# Patient Record
Sex: Male | Born: 1965 | Race: Black or African American | Hispanic: No | Marital: Single | Smoking: Current every day smoker
Health system: Southern US, Community
[De-identification: ages and names within clinical notes are randomized; demographics above are authoritative.]

## PROBLEM LIST (undated history)

## (undated) DIAGNOSIS — IMO0001 Reserved for inherently not codable concepts without codable children: Secondary | ICD-10-CM

## (undated) DIAGNOSIS — I10 Essential (primary) hypertension: Secondary | ICD-10-CM

## (undated) DIAGNOSIS — K219 Gastro-esophageal reflux disease without esophagitis: Secondary | ICD-10-CM

## (undated) DIAGNOSIS — R51 Headache: Secondary | ICD-10-CM

## (undated) HISTORY — PX: KNEE SURGERY: SHX244

---

## 2007-02-10 ENCOUNTER — Emergency Department (HOSPITAL_COMMUNITY): Admission: EM | Admit: 2007-02-10 | Discharge: 2007-02-10 | Payer: Self-pay | Admitting: Emergency Medicine

## 2007-03-18 ENCOUNTER — Ambulatory Visit: Payer: Self-pay | Admitting: Family Medicine

## 2007-03-18 LAB — CONVERTED CEMR LAB
Bilirubin Urine: NEGATIVE
Blood in Urine, dipstick: NEGATIVE
WBC Urine, dipstick: NEGATIVE
pH: 6.5

## 2007-03-20 ENCOUNTER — Encounter (INDEPENDENT_AMBULATORY_CARE_PROVIDER_SITE_OTHER): Payer: Self-pay | Admitting: Family Medicine

## 2007-03-29 ENCOUNTER — Ambulatory Visit: Payer: Self-pay | Admitting: Family Medicine

## 2007-03-29 ENCOUNTER — Telehealth (INDEPENDENT_AMBULATORY_CARE_PROVIDER_SITE_OTHER): Payer: Self-pay | Admitting: Family Medicine

## 2007-07-12 ENCOUNTER — Ambulatory Visit: Payer: Self-pay | Admitting: Family Medicine

## 2007-09-30 ENCOUNTER — Ambulatory Visit: Payer: Self-pay | Admitting: Family Medicine

## 2007-10-01 ENCOUNTER — Encounter (INDEPENDENT_AMBULATORY_CARE_PROVIDER_SITE_OTHER): Payer: Self-pay | Admitting: Family Medicine

## 2007-10-02 ENCOUNTER — Encounter (INDEPENDENT_AMBULATORY_CARE_PROVIDER_SITE_OTHER): Payer: Self-pay | Admitting: Family Medicine

## 2007-10-02 ENCOUNTER — Telehealth (INDEPENDENT_AMBULATORY_CARE_PROVIDER_SITE_OTHER): Payer: Self-pay | Admitting: *Deleted

## 2008-02-21 ENCOUNTER — Ambulatory Visit: Payer: Self-pay | Admitting: Family Medicine

## 2008-02-21 DIAGNOSIS — E669 Obesity, unspecified: Secondary | ICD-10-CM | POA: Insufficient documentation

## 2008-02-21 DIAGNOSIS — M25569 Pain in unspecified knee: Secondary | ICD-10-CM | POA: Insufficient documentation

## 2008-02-25 ENCOUNTER — Telehealth (INDEPENDENT_AMBULATORY_CARE_PROVIDER_SITE_OTHER): Payer: Self-pay | Admitting: *Deleted

## 2008-02-26 ENCOUNTER — Encounter (INDEPENDENT_AMBULATORY_CARE_PROVIDER_SITE_OTHER): Payer: Self-pay | Admitting: Family Medicine

## 2008-02-26 ENCOUNTER — Ambulatory Visit (HOSPITAL_COMMUNITY): Admission: RE | Admit: 2008-02-26 | Discharge: 2008-02-26 | Payer: Self-pay | Admitting: Family Medicine

## 2008-02-28 ENCOUNTER — Telehealth (INDEPENDENT_AMBULATORY_CARE_PROVIDER_SITE_OTHER): Payer: Self-pay | Admitting: *Deleted

## 2008-03-02 ENCOUNTER — Telehealth (INDEPENDENT_AMBULATORY_CARE_PROVIDER_SITE_OTHER): Payer: Self-pay | Admitting: *Deleted

## 2008-03-02 ENCOUNTER — Ambulatory Visit: Payer: Self-pay | Admitting: Family Medicine

## 2008-04-09 ENCOUNTER — Encounter (INDEPENDENT_AMBULATORY_CARE_PROVIDER_SITE_OTHER): Payer: Self-pay | Admitting: Family Medicine

## 2008-04-14 ENCOUNTER — Telehealth (INDEPENDENT_AMBULATORY_CARE_PROVIDER_SITE_OTHER): Payer: Self-pay | Admitting: *Deleted

## 2010-12-11 DIAGNOSIS — IMO0001 Reserved for inherently not codable concepts without codable children: Secondary | ICD-10-CM

## 2010-12-11 HISTORY — DX: Reserved for inherently not codable concepts without codable children: IMO0001

## 2011-10-10 ENCOUNTER — Inpatient Hospital Stay (HOSPITAL_COMMUNITY)
Admission: EM | Admit: 2011-10-10 | Discharge: 2011-10-11 | DRG: 143 | Disposition: A | Discharge status: Home / Self Care | Payer: BC Managed Care – PPO | Attending: Internal Medicine | Admitting: Internal Medicine

## 2011-10-10 ENCOUNTER — Emergency Department (HOSPITAL_COMMUNITY): Payer: BC Managed Care – PPO

## 2011-10-10 ENCOUNTER — Encounter: Payer: Self-pay | Admitting: *Deleted

## 2011-10-10 DIAGNOSIS — I10 Essential (primary) hypertension: Secondary | ICD-10-CM

## 2011-10-10 DIAGNOSIS — R51 Headache: Secondary | ICD-10-CM | POA: Diagnosis present

## 2011-10-10 DIAGNOSIS — R079 Chest pain, unspecified: Secondary | ICD-10-CM | POA: Diagnosis present

## 2011-10-10 DIAGNOSIS — E669 Obesity, unspecified: Secondary | ICD-10-CM

## 2011-10-10 DIAGNOSIS — F17200 Nicotine dependence, unspecified, uncomplicated: Secondary | ICD-10-CM | POA: Diagnosis present

## 2011-10-10 DIAGNOSIS — R0789 Other chest pain: Principal | ICD-10-CM | POA: Diagnosis present

## 2011-10-10 DIAGNOSIS — E876 Hypokalemia: Secondary | ICD-10-CM

## 2011-10-10 DIAGNOSIS — K219 Gastro-esophageal reflux disease without esophagitis: Secondary | ICD-10-CM

## 2011-10-10 DIAGNOSIS — M25569 Pain in unspecified knee: Secondary | ICD-10-CM

## 2011-10-10 DIAGNOSIS — Z720 Tobacco use: Secondary | ICD-10-CM

## 2011-10-10 HISTORY — DX: Headache: R51

## 2011-10-10 HISTORY — DX: Essential (primary) hypertension: I10

## 2011-10-10 LAB — URINALYSIS, ROUTINE W REFLEX MICROSCOPIC
Nitrite: NEGATIVE
Specific Gravity, Urine: 1.01 (ref 1.005–1.030)
Urobilinogen, UA: 0.2 mg/dL (ref 0.0–1.0)

## 2011-10-10 LAB — LIPASE, BLOOD: Lipase: 19 U/L (ref 11–59)

## 2011-10-10 LAB — CBC
Hemoglobin: 16 g/dL (ref 13.0–17.0)
MCH: 30.2 pg (ref 26.0–34.0)
MCHC: 36.1 g/dL — ABNORMAL HIGH (ref 30.0–36.0)
MCV: 83.6 fL (ref 78.0–100.0)
Platelets: 173 10*3/uL (ref 150–400)
RBC: 5.3 MIL/uL (ref 4.22–5.81)

## 2011-10-10 LAB — DIFFERENTIAL
Eosinophils Absolute: 0.1 10*3/uL (ref 0.0–0.7)
Eosinophils Relative: 2 % (ref 0–5)
Lymphs Abs: 1.5 10*3/uL (ref 0.7–4.0)
Monocytes Relative: 9 % (ref 3–12)

## 2011-10-10 LAB — COMPREHENSIVE METABOLIC PANEL
BUN: 9 mg/dL (ref 6–23)
Calcium: 9.1 mg/dL (ref 8.4–10.5)
GFR calc Af Amer: >90 mL/min (ref >90)
Glucose, Bld: 147 mg/dL — ABNORMAL HIGH (ref 70–99)
Total Protein: 6.9 g/dL (ref 6.0–8.3)

## 2011-10-10 MED ORDER — LISINOPRIL 5 MG PO TABS
5.0000 mg | ORAL_TABLET | Freq: Every day | ORAL | Status: DC
  Administered 2011-10-11: 5 mg via ORAL
  Filled 2011-10-10: qty 1

## 2011-10-10 MED ORDER — ASPIRIN EC 325 MG PO TBEC
325.0000 mg | DELAYED_RELEASE_TABLET | Freq: Every day | ORAL | Status: DC
  Administered 2011-10-11: 325 mg via ORAL
  Filled 2011-10-10: qty 1

## 2011-10-10 MED ORDER — ALUM & MAG HYDROXIDE-SIMETH 200-200-20 MG/5ML PO SUSP: 30.0000 mL | Freq: Four times a day (QID) | ORAL | Status: DC | PRN

## 2011-10-10 MED ORDER — MORPHINE SULFATE 2 MG/ML IJ SOLN: 2.0000 mg | INTRAMUSCULAR | Status: DC | PRN

## 2011-10-10 MED ORDER — DOCUSATE SODIUM 100 MG PO CAPS
100.0000 mg | ORAL_CAPSULE | Freq: Two times a day (BID) | ORAL | Status: DC
  Administered 2011-10-11: 100 mg via ORAL
  Filled 2011-10-10: qty 1

## 2011-10-10 MED ORDER — ONDANSETRON HCL 4 MG/2ML IJ SOLN: 4.0000 mg | Freq: Four times a day (QID) | INTRAMUSCULAR | Status: DC | PRN

## 2011-10-10 MED ORDER — FAMOTIDINE IN NACL 20-0.9 MG/50ML-% IV SOLN
20.0000 mg | Freq: Once | INTRAVENOUS | Status: AC
  Administered 2011-10-10: 20 mg via INTRAVENOUS
  Filled 2011-10-10: qty 50

## 2011-10-10 MED ORDER — GI COCKTAIL ~~LOC~~
30.0000 mL | Freq: Once | ORAL | Status: AC
  Administered 2011-10-10: 30 mL via ORAL
  Filled 2011-10-10: qty 30

## 2011-10-10 MED ORDER — SODIUM CHLORIDE 0.9 % IV SOLN
INTRAVENOUS | Status: DC
  Administered 2011-10-10: 18:00:00 via INTRAVENOUS

## 2011-10-10 MED ORDER — HEPARIN SODIUM (PORCINE) 5000 UNIT/ML IJ SOLN
5000.0000 [IU] | Freq: Three times a day (TID) | INTRAMUSCULAR | Status: DC
  Administered 2011-10-10 – 2011-10-11 (×2): 5000 [IU] via SUBCUTANEOUS
  Filled 2011-10-10 (×2): qty 1

## 2011-10-10 MED ORDER — GI COCKTAIL ~~LOC~~
30.0000 mL | Freq: Three times a day (TID) | ORAL | Status: DC | PRN
  Filled 2011-10-10: qty 30

## 2011-10-10 MED ORDER — POTASSIUM CHLORIDE 20 MEQ PO PACK
20.0000 meq | PACK | Freq: Once | ORAL | Status: AC
  Administered 2011-10-10: 20 meq via ORAL
  Filled 2011-10-10: qty 1

## 2011-10-10 MED ORDER — ASPIRIN 81 MG PO CHEW
324.0000 mg | CHEWABLE_TABLET | Freq: Once | ORAL | Status: AC
  Administered 2011-10-10: 324 mg via ORAL
  Filled 2011-10-10: qty 4

## 2011-10-10 MED ORDER — ASPIRIN EC 81 MG PO TBEC: 81.0000 mg | DELAYED_RELEASE_TABLET | Freq: Every day | ORAL | Status: DC

## 2011-10-10 MED ORDER — NITROGLYCERIN 0.4 MG SL SUBL: 0.4000 mg | SUBLINGUAL_TABLET | SUBLINGUAL | Status: DC | PRN

## 2011-10-10 MED ORDER — ONDANSETRON HCL 4 MG PO TABS: 4.0000 mg | ORAL_TABLET | Freq: Four times a day (QID) | ORAL | Status: DC | PRN

## 2011-10-10 MED ORDER — SENNA 8.6 MG PO TABS: 2.0000 | ORAL_TABLET | Freq: Every day | ORAL | Status: DC | PRN

## 2011-10-10 NOTE — ED Notes (Signed)
Dr. Kaylyn Layer here to assess pt.

## 2011-10-10 NOTE — ED Notes (Signed)
Pt c/o chest tightness x 2 days. States that it feels like indigestion and that he has been eating Tums a lot. Pt denies shortness of breath, nausea, vomiting or dizziness. Pt alert and oriented x 3. Skin warm and dry. Color pink.

## 2011-10-10 NOTE — ED Notes (Signed)
Pt being transferred to floor at this time

## 2011-10-10 NOTE — ED Notes (Signed)
Pt alert and oriented x 3. Skin warm and dry. Color pink. Breath sounds clear and equal bilaterally. Pt showing NSR on CCM.

## 2011-10-10 NOTE — H&P (Signed)
PCP:   Franchot Heidelberg, MD  Pt states he's seen Dr. Irena Cords at [facility] in [place], and went to see [facility] today but unclear who.   Chief Complaint:  Chest tightness  HPI: Alexander Ellison is an 45 y.o. male with no prior cardiac history, but with GERD and current tobacco abuse who presents with a couple weeks of chest tightness.   For the past couple weeks he's had substernal chest tightness, possibly lasting 15-30 mins and feeling like it's "caving in." He has been smoking more heavily recently and states the tightness is wrose when he smokes, b/c he smokes constantly and heavily on the days he's not at work. There is no radiation, no associated nausea, dizziness, diaphoresis but he gets a bit vague on other symtpoms at times. He states overall it is much better when he's not smoking such that it doesn't bother him when he's not smoking.   However, he also relates a history of bad GERD and "heartburn" such that he constantly takes [place]. He describes classic GERD in that it hurts when he lays down at night, at wakes up with headaches. He woke up once recently and threw up.   He does has RF's of tobacco abuse, possibly HTN, and family history. However, he does not endorse prior cardiac history or angina -- he plays basketball once a week and can play vigorously without any angina at all.   Review of Systems:  As above, o/w negative for f/c/ns, cough, wheeziness, GI issues, SOB, dyspnea. He does endorse occasional BRBPR when he has to strain to have a BM. O/w ROS completely negative   Past Medical History  Diagnosis Date  . Hypertension     not on medication  . Headache     Past Surgical History  Procedure Date  . Knee surgery     right    Medications:  HOME MEDS:  I removed ASA 81 from the prior med list reconciliation bc he said he doesn't take anything regularly  Prior to Admission medications   Medication Sig Start Date End Date Taking? Authorizing  Provider  Aspirin-Salicylamide-Caffeine (BC HEADACHE POWDER PO) Take 1 packet by mouth 2 (two) times daily as needed. For pain    Yes Historical Provider, MD  Multiple Vitamins-Minerals (MULTIVITAMINS THER. W/MINERALS) TABS Take 1 tablet by mouth daily.     Yes Historical Provider, MD    Allergies:  Allergies  Allergen Reactions  . Oxycodone-Acetaminophen     REACTION: Palpatations and Anxiety    Social History:  He smokes a PPD on his days off. He quit awhile ago, but started back up several years ago.   reports that he has been smoking Cigarettes.  He has a 6 pack-year smoking history. He does not have any smokeless tobacco history on file. He reports that he does not drink alcohol or use illicit drugs.  Family History: Family History  Problem Relation Age of Onset  . Diabetes Mother   . Cancer Mother   . Emphysema Father     7 of his siblings have DM. Mother dec at 34 with cancer, HTN. Father dec at 28 with heavy smoking, emphysema, and one lung collapse.   Physical Exam: Filed Vitals:   10/10/11 2035 10/10/11 2049 10/10/11 2151 10/10/11 2209  BP: 130/93 140/92  128/80  Pulse: 66 89  64  Temp:    97.9 F (36.6 C)  TempSrc:    Oral  Resp: 15 21  20   Height:  5\' 10"  (1.778 m)   Weight:   105.3 kg (232 lb 2.3 oz)   SpO2: 98% 96%  94%   Blood pressure 128/80, pulse 64, temperature 97.9 F (36.6 C), temperature source Oral, resp. rate 20, height 5\' 10"  (1.778 m), weight 105.3 kg (232 lb 2.3 oz), SpO2 94.00%.  Gen: Large but not obese M in no distress, friend at bedside, able to relate history well, no distress, doesn't appear uncomfortable.  HEENT: PERRL, EOMI, sclera clear, no icterus. Mouth moist, normal appearing  Lungs CTAB no w/c/r/r Heart RRR, normal S1/S2, no m/g, benign Abdomen a bit protuberant but soft, non peritoneal, non distended, non tender Extrems: warm, well perfused, no BLE edema, radials palpable, benign Neuro: non focal. Alert, pleasant, moves extrems  spontaneously    Labs & Imaging Results for orders placed during the hospital encounter of 10/10/11 (from the past 48 hour(s))  CBC     Status: Abnormal   Collection Time   10/10/11  5:15 PM      Component Value Range Comment   WBC 5.1  4.0 - 10.5 (K/uL)    RBC 5.30  4.22 - 5.81 (MIL/uL)    Hemoglobin 16.0  13.0 - 17.0 (g/dL)    HCT 04.5  40.9 - 81.1 (%)    MCV 83.6  78.0 - 100.0 (fL)    MCH 30.2  26.0 - 34.0 (pg)    MCHC 36.1 (*) 30.0 - 36.0 (g/dL)    RDW 91.4  78.2 - 95.6 (%)    Platelets 173  150 - 400 (K/uL)   DIFFERENTIAL     Status: Normal   Collection Time   10/10/11  5:15 PM      Component Value Range Comment   Neutrophils Relative 59  43 - 77 (%)    Neutro Abs 3.0  1.7 - 7.7 (K/uL)    Lymphocytes Relative 30  12 - 46 (%)    Lymphs Abs 1.5  0.7 - 4.0 (K/uL)    Monocytes Relative 9  3 - 12 (%)    Monocytes Absolute 0.5  0.1 - 1.0 (K/uL)    Eosinophils Relative 2  0 - 5 (%)    Eosinophils Absolute 0.1  0.0 - 0.7 (K/uL)    Basophils Relative 1  0 - 1 (%)    Basophils Absolute 0.0  0.0 - 0.1 (K/uL)   COMPREHENSIVE METABOLIC PANEL     Status: Abnormal   Collection Time   10/10/11  5:15 PM      Component Value Range Comment   Sodium 135  135 - 145 (mEq/L)    Potassium 3.4 (*) 3.5 - 5.1 (mEq/L)    Chloride 100  96 - 112 (mEq/L)    CO2 25  19 - 32 (mEq/L)    Glucose, Bld 147 (*) 70 - 99 (mg/dL)    BUN 9  6 - 23 (mg/dL)    Creatinine, Ser 2.13  0.50 - 1.35 (mg/dL)    Calcium 9.1  8.4 - 10.5 (mg/dL)    Total Protein 6.9  6.0 - 8.3 (g/dL)    Albumin 3.8  3.5 - 5.2 (g/dL)    AST 19  0 - 37 (U/L)    ALT 27  0 - 53 (U/L)    Alkaline Phosphatase 79  39 - 117 (U/L)    Total Bilirubin 0.4  0.3 - 1.2 (mg/dL)    GFR calc non Af Amer 86 (*) >90 (mL/min)    GFR calc Af Amer >90  >  90 (mL/min)   LIPASE, BLOOD     Status: Normal   Collection Time   10/10/11  5:15 PM      Component Value Range Comment   Lipase 19  11 - 59 (U/L)   URINALYSIS, ROUTINE W REFLEX MICROSCOPIC      Status: Normal   Collection Time   10/10/11  6:11 PM      Component Value Range Comment   Color, Urine YELLOW  YELLOW     Appearance CLEAR  CLEAR     Specific Gravity, Urine 1.010  1.005 - 1.030     pH 5.5  5.0 - 8.0     Glucose, UA NEGATIVE  NEGATIVE (mg/dL)    Hgb urine dipstick NEGATIVE  NEGATIVE     Bilirubin Urine NEGATIVE  NEGATIVE     Ketones, ur NEGATIVE  NEGATIVE (mg/dL)    Protein, ur NEGATIVE  NEGATIVE (mg/dL)    Urobilinogen, UA 0.2  0.0 - 1.0 (mg/dL)    Nitrite NEGATIVE  NEGATIVE     Leukocytes, UA NEGATIVE  NEGATIVE  MICROSCOPIC NOT DONE ON URINES WITH NEGATIVE PROTEIN, BLOOD, LEUKOCYTES, NITRITE, OR GLUCOSE <1000 mg/dL.  POCT I-STAT TROPONIN I     Status: Normal   Collection Time   10/10/11  7:21 PM      Component Value Range Comment   Troponin i, poc 0.00  0.00 - 0.08 (ng/mL)    Comment 3             Dg Chest 2 View  10/10/2011  *RADIOLOGY REPORT*  Clinical Data: Chest pain, smoker.  CHEST - 2 VIEW  Comparison: None  Findings: Heart and mediastinal contours are within normal limits. No focal opacities or effusions.  No acute bony abnormality.  IMPRESSION: No active disease.  Original Report Authenticated By: Cyndie Chime, M.D.   EKG: at outpt office shows NSR, 57bpm, LAD, normal P waves, QRS 91 msec, no ST deviations, but prominent flipped intervted TW in III, AVF that are asymmetric. No prior. LVH in leads I and aVL  EKG on arrival to ED: The TWI in II is improved, and aVF now shows TWF instead of inversion.   Impression Present on Admission:  .Hypertension .Tobacco abuse .GERD (gastroesophageal reflux disease) .Chest pain  PLAN: 1. Chest pain: He does not have a typical chronic stable anginal pattern and can play basketball without cardiopulmonary complaints. His description of the chest tightness is also not classic, but fairly concerning. At this point unstable angina a consideration. No CE's at present to suggest NSTEMI. He does have RF's of heavy tobacco  and likely underlying HTN as he is a bit hypertense while I evaluate him, but not previously diagnosed or treated. Many family members with DM but not quite clear heart disease.   RF's are substantial enough that I would stress test him to further risk stratify him. For overnight will trend enzymes and EKG and treat with Aspirin 325 only. Will start Lisinopril low dose for HTN as well. Get lipids, A1c. Consider inpatient cardiology consultation.   Of note, DDx includes a bronchitis / bronchospasm picture given heavy tobacco use, and also the fact that he endorses significant GERD. Therefore, advised tobacco cessation, and also will give GI cocktail, maalox.   2. HTN: Lisinopril as above.   Other plans as per orders.   Sallyann Kinnaird 10/10/2011, 10:54 PM

## 2011-10-10 NOTE — ED Provider Notes (Signed)
History     CSN: [id number] Arrival date & time: 10/10/2011  4:40 PM    Chief Complaint  Patient presents with  . Chest Pain     HPI Pt was seen at 1710.  Per pt, c/o gradual onset and worsening of multiple intermittent episodes of mid-sternal chest "pain" over the past several months, worse over the past several days.  Pt describes the CP as "heavy," "indigestion," "pressure," and "tightness."  Has been assoc with SOB.  Symptoms last approx 15-30 min before resolving.  States he has been taking tums with intermittent relief.  States he cannot think of a specific trigger for his symptoms other than "when I smoke a cigarette."  Pt was eval at his PMD's ofc today PTA and was sent to ED for further eval/admit. Denies palpitations, no cough, no back pain, no abd pain, no N/V/D.   History reviewed. No pertinent past medical history.  Past Surgical History  Procedure Date  . Knee surgery     right    FHx:  DM   History  Substance Use Topics  . Smoking status: Current Everyday Smoker -- 1.0 packs/day  . Smokeless tobacco: Not on file  . Alcohol Use: Yes     Review of Systems ROS: Statement: All systems negative except as marked or noted in the HPI; Constitutional: Negative for fever and chills. ; ; Eyes: Negative for eye pain, redness and discharge. ; ; ENMT: Negative for ear pain, hoarseness, nasal congestion, sinus pressure and sore throat. ; ; Cardiovascular: +CP, SOB. Negative for palpitations, diaphoresis, and peripheral edema. ; ; Respiratory: Negative for cough, wheezing and stridor. ; ; Gastrointestinal: Negative for nausea, vomiting, diarrhea and abdominal pain, blood in stool, hematemesis, jaundice and rectal bleeding. . ; ; Genitourinary: Negative for dysuria, flank pain and hematuria. ; ; Musculoskeletal: Negative for back pain and neck pain. Negative for swelling and trauma.; ; Skin: Negative for pruritus, rash, abrasions, blisters, bruising and skin lesion.; ; Neuro:  Negative for headache, lightheadedness and neck stiffness. Negative for weakness, altered level of consciousness , altered mental status, extremity weakness, paresthesias, involuntary movement, seizure and syncope.     Allergies  Oxycodone-acetaminophen  Home Medications   Current Outpatient Rx  Name Route Sig Dispense Refill  . ASPIRIN EC 81 MG PO TBEC Oral Take 81 mg by mouth daily.      . BC HEADACHE POWDER PO Oral Take 1 packet by mouth 2 (two) times daily as needed. For pain     . THERA M PLUS PO TABS Oral Take 1 tablet by mouth daily.        BP 139/92  Pulse 66  Temp(Src) 98.4 F (36.9 C) (Oral)  Resp 16  Ht 5\' 10"  (1.778 m)  Wt 230 lb (104.327 kg)  BMI 33.00 kg/m2  SpO2 97%  Physical Exam 1715: Physical examination:  Nursing notes reviewed; Vital signs and O2 SAT reviewed;  Constitutional: Well developed, Well nourished, Well hydrated, In no acute distress; Head:  Normocephalic, atraumatic; Eyes: EOMI, PERRL, No scleral icterus; ENMT: Mouth and pharynx normal, Mucous membranes moist; Neck: Supple, Full range of motion, No lymphadenopathy; Cardiovascular: Regular rate and rhythm, No murmur, rub, or gallop; Respiratory: Breath sounds clear & equal bilaterally, No rales, rhonchi, wheezes, or rub, Normal respiratory effort/excursion; Chest: Nontender, Movement normal; Abdomen: Soft, Nontender, Nondistended, Normal bowel sounds; Extremities: Pulses normal, No tenderness, No edema, No calf edema or asymmetry.; Neuro: AA&Ox3, Major CN grossly intact.  No gross focal motor or  sensory deficits in extremities.; Skin: Color normal, Warm, Dry   ED Course  Procedures    MDM  MDM Reviewed: nursing note and vitals Reviewed previous: ECG Interpretation: ECG, labs and x-ray    Date: 10/10/2011  Rate: 73  Rhythm: normal sinus rhythm and sinus arrhythmia  QRS Axis: left  Intervals: normal  ST/T Wave abnormalities: normal  Conduction Disutrbances:none  Narrative Interpretation:   LVH  Old EKG Reviewed: none available.  Results for orders placed during the hospital encounter of 10/10/11  CBC      Component Value Range   WBC 5.1  4.0 - 10.5 (K/uL)   RBC 5.30  4.22 - 5.81 (MIL/uL)   Hemoglobin 16.0  13.0 - 17.0 (g/dL)   HCT 16.1  09.6 - 04.5 (%)   MCV 83.6  78.0 - 100.0 (fL)   MCH 30.2  26.0 - 34.0 (pg)   MCHC 36.1 (*) 30.0 - 36.0 (g/dL)   RDW 40.9  81.1 - 91.4 (%)   Platelets 173  150 - 400 (K/uL)  DIFFERENTIAL      Component Value Range   Neutrophils Relative 59  43 - 77 (%)   Neutro Abs 3.0  1.7 - 7.7 (K/uL)   Lymphocytes Relative 30  12 - 46 (%)   Lymphs Abs 1.5  0.7 - 4.0 (K/uL)   Monocytes Relative 9  3 - 12 (%)   Monocytes Absolute 0.5  0.1 - 1.0 (K/uL)   Eosinophils Relative 2  0 - 5 (%)   Eosinophils Absolute 0.1  0.0 - 0.7 (K/uL)   Basophils Relative 1  0 - 1 (%)   Basophils Absolute 0.0  0.0 - 0.1 (K/uL)  COMPREHENSIVE METABOLIC PANEL      Component Value Range   Sodium 135  135 - 145 (mEq/L)   Potassium 3.4 (*) 3.5 - 5.1 (mEq/L)   Chloride 100  96 - 112 (mEq/L)   CO2 25  19 - 32 (mEq/L)   Glucose, Bld 147 (*) 70 - 99 (mg/dL)   BUN 9  6 - 23 (mg/dL)   Creatinine, Ser 7.82  0.50 - 1.35 (mg/dL)   Calcium 9.1  8.4 - 95.6 (mg/dL)   Total Protein 6.9  6.0 - 8.3 (g/dL)   Albumin 3.8  3.5 - 5.2 (g/dL)   AST 19  0 - 37 (U/L)   ALT 27  0 - 53 (U/L)   Alkaline Phosphatase 79  39 - 117 (U/L)   Total Bilirubin 0.4  0.3 - 1.2 (mg/dL)   GFR calc non Af Amer 86 (*) >90 (mL/min)   GFR calc Af Amer >90  >90 (mL/min)  LIPASE, BLOOD      Component Value Range   Lipase 19  11 - 59 (U/L)  URINALYSIS, ROUTINE W REFLEX MICROSCOPIC      Component Value Range   Color, Urine YELLOW  YELLOW    Appearance CLEAR  CLEAR    Specific Gravity, Urine 1.010  1.005 - 1.030    pH 5.5  5.0 - 8.0    Glucose, UA NEGATIVE  NEGATIVE (mg/dL)   Hgb urine dipstick NEGATIVE  NEGATIVE    Bilirubin Urine NEGATIVE  NEGATIVE    Ketones, ur NEGATIVE  NEGATIVE (mg/dL)    Protein, ur NEGATIVE  NEGATIVE (mg/dL)   Urobilinogen, UA 0.2  0.0 - 1.0 (mg/dL)   Nitrite NEGATIVE  NEGATIVE    Leukocytes, UA NEGATIVE  NEGATIVE   POCT I-STAT TROPONIN I      Component  Value Range   Troponin i, poc 0.00  0.00 - 0.08 (ng/mL)   Comment 3            Dg Chest 2 View  10/10/2011  *RADIOLOGY REPORT*  Clinical Data: Chest pain, smoker.  CHEST - 2 VIEW  Comparison: None  Findings: Heart and mediastinal contours are within normal limits. No focal opacities or effusions.  No acute bony abnormality.  IMPRESSION: No active disease.  Original Report Authenticated By: Cyndie Chime, M.D.   8:18 PM:  Feels improved after ASA and ntg; though continues to minimize his symtoms, stating that "my pains went away just because I haven't had a cigarette."  Long talk with pt and family re: my concern for poss anginal symptoms as cause for pain.  Dx testing d/w pt and family.  Questions answered.  Verb understanding, agreeable to admit.  T/C to [facility] Dr. Kaylyn Layer, case discussed, including:  HPI, pertinent PM/SHx, VS/PE, dx testing, ED course and treatment.  Agreeable to admit.  Requests to write temporary orders, tele bed.   Allina Riches Allison Quarry, DO 10/12/11 2122

## 2011-10-11 DIAGNOSIS — E876 Hypokalemia: Secondary | ICD-10-CM | POA: Diagnosis present

## 2011-10-11 DIAGNOSIS — R079 Chest pain, unspecified: Secondary | ICD-10-CM

## 2011-10-11 LAB — CARDIAC PANEL(CRET KIN+CKTOT+MB+TROPI)
CK, MB: 1.6 ng/mL (ref 0.3–4.0)
CK, MB: 1.5 ng/mL (ref 0.3–4.0)
Relative Index: 1.5 (ref 0.0–2.5)
Relative Index: INVALID (ref 0.0–2.5)
Total CK: 109 U/L (ref 7–232)
Total CK: 99 U/L (ref 7–232)
Troponin I: <0.3 ng/mL
Troponin I: <0.3 ng/mL

## 2011-10-11 LAB — CBC
MCV: 84.2 fL (ref 78.0–100.0)
Platelets: 162 10*3/uL (ref 150–400)
RBC: 5.25 MIL/uL (ref 4.22–5.81)
RDW: 12.5 % (ref 11.5–15.5)
WBC: 4 10*3/uL (ref 4.0–10.5)

## 2011-10-11 LAB — BASIC METABOLIC PANEL WITH GFR
BUN: 9 mg/dL (ref 6–23)
CO2: 26 meq/L (ref 19–32)
Calcium: 8.9 mg/dL (ref 8.4–10.5)
Chloride: 105 meq/L (ref 96–112)
Creatinine, Ser: 0.99 mg/dL (ref 0.50–1.35)
GFR calc Af Amer: >90 mL/min
GFR calc non Af Amer: >90 mL/min
Glucose, Bld: 109 mg/dL — ABNORMAL HIGH (ref 70–99)
Potassium: 4.2 meq/L (ref 3.5–5.1)
Sodium: 139 meq/L (ref 135–145)

## 2011-10-11 LAB — LIPID PANEL
Cholesterol: 159 mg/dL (ref 0–200)
HDL: 44 mg/dL
LDL Cholesterol: 83 mg/dL (ref 0–99)
Total CHOL/HDL Ratio: 3.6 ratio
Triglycerides: 161 mg/dL — ABNORMAL HIGH
VLDL: 32 mg/dL (ref 0–40)

## 2011-10-11 LAB — HEMOGLOBIN A1C: Hgb A1c MFr Bld: 5.6 % (ref <5.7)

## 2011-10-11 LAB — T4, FREE: Free T4: 1.38 ng/dL (ref 0.80–1.80)

## 2011-10-11 MED ORDER — LISINOPRIL 5 MG PO TABS: 10.0000 mg | ORAL_TABLET | Freq: Every day | ORAL | Status: AC

## 2011-10-11 MED ORDER — PANTOPRAZOLE SODIUM 40 MG PO TBEC: 40.0000 mg | DELAYED_RELEASE_TABLET | Freq: Every day | ORAL | Status: AC

## 2011-10-11 MED ORDER — ASPIRIN EC 81 MG PO TBEC: 81.0000 mg | DELAYED_RELEASE_TABLET | Freq: Every day | ORAL | Status: AC

## 2011-10-11 MED ORDER — PANTOPRAZOLE SODIUM 40 MG PO TBEC
40.0000 mg | DELAYED_RELEASE_TABLET | Freq: Every day | ORAL | Status: DC
  Administered 2011-10-11: 40 mg via ORAL
  Filled 2011-10-11: qty 1

## 2011-10-11 NOTE — Progress Notes (Signed)
Prescriptions given,states understanding of discharge 

## 2011-10-11 NOTE — Discharge Summary (Signed)
Physician Discharge Summary  Demitrius Crass MRN: [id number] DOB/AGE: [birth year] 45 y.o.  PCP: Franchot Heidelberg, MD   Admit date: 10/10/2011 Discharge date: 10/11/2011  Discharge Diagnoses:  1. Chest pain, cardiac enzymes were within normal limits. 2. Hypertension. 3. Tobacco abuse. 4. Gastroesophageal reflux disease. 5. Hypokalemia. 6. Chronic sinus headaches.  Current Discharge Medication List    START taking these medications   Details  aspirin EC 81 MG tablet Take 1 tablet (81 mg total) by mouth daily.    lisinopril (ZESTRIL) 5 MG tablet Take 2 tablets (10 mg total) by mouth daily. Qty: 30 tablet, Refills: 2    pantoprazole (PROTONIX) 40 MG tablet Take 1 tablet (40 mg total) by mouth daily. FOR ACID REFLUX. Qty: 30 tablet, Refills: 2      CONTINUE these medications which have NOT CHANGED   Details  Multiple Vitamins-Minerals (MULTIVITAMINS THER. W/MINERALS) TABS Take 1 tablet by mouth daily.        STOP taking these medications     Aspirin-Salicylamide-Caffeine (BC HEADACHE POWDER PO)         Discharge Condition: Improved and stable.  Disposition: Home.  Consults: Nona Dell, M.D.   Significant Diagnostic Studies: Dg Chest 2 View  10/10/2011  *RADIOLOGY REPORT*  Clinical Data: Chest pain, smoker.  CHEST - 2 VIEW  Comparison: None  Findings: Heart and mediastinal contours are within normal limits. No focal opacities or effusions.  No acute bony abnormality.  IMPRESSION: No active disease.  Original Report Authenticated By: Cyndie Chime, M.D.    Microbiology: No results found for this or any previous visit (from the past 240 hour(s)).   Labs: Results for orders placed during the hospital encounter of 10/10/11 (from the past 48 hour(s))  CBC     Status: Abnormal   Collection Time   10/10/11  5:15 PM      Component Value Range Comment   WBC 5.1  4.0 - 10.5 (K/uL)    RBC 5.30  4.22 - 5.81 (MIL/uL)    Hemoglobin 16.0  13.0 - 17.0 (g/dL)     HCT 40.9  81.1 - 91.4 (%)    MCV 83.6  78.0 - 100.0 (fL)    MCH 30.2  26.0 - 34.0 (pg)    MCHC 36.1 (*) 30.0 - 36.0 (g/dL)    RDW 78.2  95.6 - 21.3 (%)    Platelets 173  150 - 400 (K/uL)   DIFFERENTIAL     Status: Normal   Collection Time   10/10/11  5:15 PM      Component Value Range Comment   Neutrophils Relative 59  43 - 77 (%)    Neutro Abs 3.0  1.7 - 7.7 (K/uL)    Lymphocytes Relative 30  12 - 46 (%)    Lymphs Abs 1.5  0.7 - 4.0 (K/uL)    Monocytes Relative 9  3 - 12 (%)    Monocytes Absolute 0.5  0.1 - 1.0 (K/uL)    Eosinophils Relative 2  0 - 5 (%)    Eosinophils Absolute 0.1  0.0 - 0.7 (K/uL)    Basophils Relative 1  0 - 1 (%)    Basophils Absolute 0.0  0.0 - 0.1 (K/uL)   COMPREHENSIVE METABOLIC PANEL     Status: Abnormal   Collection Time   10/10/11  5:15 PM      Component Value Range Comment   Sodium 135  135 - 145 (mEq/L)    Potassium 3.4 (*) 3.5 - 5.1 (  mEq/L)    Chloride 100  96 - 112 (mEq/L)    CO2 25  19 - 32 (mEq/L)    Glucose, Bld 147 (*) 70 - 99 (mg/dL)    BUN 9  6 - 23 (mg/dL)    Creatinine, Ser 2.13  0.50 - 1.35 (mg/dL)    Calcium 9.1  8.4 - 10.5 (mg/dL)    Total Protein 6.9  6.0 - 8.3 (g/dL)    Albumin 3.8  3.5 - 5.2 (g/dL)    AST 19  0 - 37 (U/L)    ALT 27  0 - 53 (U/L)    Alkaline Phosphatase 79  39 - 117 (U/L)    Total Bilirubin 0.4  0.3 - 1.2 (mg/dL)    GFR calc non Af Amer 86 (*) >90 (mL/min)    GFR calc Af Amer >90  >90 (mL/min)   LIPASE, BLOOD     Status: Normal   Collection Time   10/10/11  5:15 PM      Component Value Range Comment   Lipase 19  11 - 59 (U/L)   URINALYSIS, ROUTINE W REFLEX MICROSCOPIC     Status: Normal   Collection Time   10/10/11  6:11 PM      Component Value Range Comment   Color, Urine YELLOW  YELLOW     Appearance CLEAR  CLEAR     Specific Gravity, Urine 1.010  1.005 - 1.030     pH 5.5  5.0 - 8.0     Glucose, UA NEGATIVE  NEGATIVE (mg/dL)    Hgb urine dipstick NEGATIVE  NEGATIVE     Bilirubin Urine NEGATIVE   NEGATIVE     Ketones, ur NEGATIVE  NEGATIVE (mg/dL)    Protein, ur NEGATIVE  NEGATIVE (mg/dL)    Urobilinogen, UA 0.2  0.0 - 1.0 (mg/dL)    Nitrite NEGATIVE  NEGATIVE     Leukocytes, UA NEGATIVE  NEGATIVE  MICROSCOPIC NOT DONE ON URINES WITH NEGATIVE PROTEIN, BLOOD, LEUKOCYTES, NITRITE, OR GLUCOSE <1000 mg/dL.  POCT I-STAT TROPONIN I     Status: Normal   Collection Time   10/10/11  7:21 PM      Component Value Range Comment   Troponin i, poc 0.00  0.00 - 0.08 (ng/mL)    Comment 3            CARDIAC PANEL(CRET KIN+CKTOT+MB+TROPI)     Status: Normal   Collection Time   10/11/11 12:13 AM      Component Value Range Comment   Total CK 109  7 - 232 (U/L)    CK, MB 1.6  0.3 - 4.0 (ng/mL)    Troponin I <0.30  <0.30 (ng/mL)    Relative Index 1.5  0.0 - 2.5    CARDIAC PANEL(CRET KIN+CKTOT+MB+TROPI)     Status: Normal   Collection Time   10/11/11  8:04 AM      Component Value Range Comment   Total CK 99  7 - 232 (U/L)    CK, MB 1.5  0.3 - 4.0 (ng/mL)    Troponin I <0.30  <0.30 (ng/mL)    Relative Index RELATIVE INDEX IS INVALID  0.0 - 2.5    LIPID PANEL     Status: Abnormal   Collection Time   10/11/11  8:10 AM      Component Value Range Comment   Cholesterol 159  0 - 200 (mg/dL)    Triglycerides 086 (*) <150 (mg/dL)    HDL 44  >57 (mg/dL)  Total CHOL/HDL Ratio 3.6      VLDL 32  0 - 40 (mg/dL)    LDL Cholesterol 83  0 - 99 (mg/dL)   BASIC METABOLIC PANEL     Status: Abnormal   Collection Time   10/11/11  8:12 AM      Component Value Range Comment   Sodium 139  135 - 145 (mEq/L)    Potassium 4.2  3.5 - 5.1 (mEq/L) DELTA CHECK NOTED   Chloride 105  96 - 112 (mEq/L)    CO2 26  19 - 32 (mEq/L)    Glucose, Bld 109 (*) 70 - 99 (mg/dL)    BUN 9  6 - 23 (mg/dL)    Creatinine, Ser 1.61  0.50 - 1.35 (mg/dL)    Calcium 8.9  8.4 - 10.5 (mg/dL)    GFR calc non Af Amer >90  >90 (mL/min)    GFR calc Af Amer >90  >90 (mL/min)   CBC     Status: Normal   Collection Time   10/11/11  8:12 AM       Component Value Range Comment   WBC 4.0  4.0 - 10.5 (K/uL)    RBC 5.25  4.22 - 5.81 (MIL/uL)    Hemoglobin 15.6  13.0 - 17.0 (g/dL)    HCT 09.6  04.5 - 40.9 (%)    MCV 84.2  78.0 - 100.0 (fL)    MCH 29.7  26.0 - 34.0 (pg)    MCHC 35.3  30.0 - 36.0 (g/dL)    RDW 81.1  91.4 - 78.2 (%)    Platelets 162  150 - 400 (K/uL)      HPI : The patient is a 46 romance with a past medical history significant for hypertension, heartburn, and tobacco abuse. He presented to the emergency department on 10/10/2011 with a chief complaint of chest tightness. In the emergency department, he was noted to be hemodynamically stable. His chest x-ray revealed no acute cardiopulmonary disease. His troponin I was within normal limits. His EKG revealed minor T-wave changes. His serum potassium was 3.4 and his venous glucose was 147 (nonfasting), otherwise, his laboratory data were unremarkable. He was admitted for further evaluation and management.  HOSPITAL COURSE: The patient was given IV Pepcid and a GI cocktail in the emergency department. He was subsequently started on 325 mg of aspirin daily and lisinopril for hypertension. Protonix was added at 40 mg daily. Tobacco cessation counseling was ordered. He was advised to stop smoking. His pain was treated with as needed morphine. Supplemental nitroglycerin was ordered, however, he did not require it or ask for it.  For further evaluation, a number of studies were ordered. His cardiac enzymes were well within normal limits. His fasting lipid profile was unremarkable. His fasting venous glucose normalized.  The results of the hemoglobin A1c, TSH, and free T4 were pending at the time of discharge.  Cardiologist, Dr. Nona Dell, was consulted. He evaluated the patient. Given the patient's overall stability and that he was free of chest pain, Dr. Diona Browner recommended an outpatient exercise echocardiogram, which was scheduled by him. The patient will undergo the stress  echocardiogram next week. He will also followup with Dr. Diona Browner as well.  At the time of hospital discharge, the patient was hemodynamically stable and afebrile. He had no complaints of chest pain. He was advised to continue lisinopril and Protonix as prescribed during the hospitalization for treatment of hypertension and probable gastroesophageal reflux disease. He was instructed to continue  aspirin at 81 mg daily until he was reevaluated by the cardiology team next week. Again, he was encouraged to stop smoking.    Discharge Exam: Blood pressure 120/81, pulse 61, temperature 97.6 F (36.4 C), temperature source Oral, resp. rate 20, height 5\' 10"  (1.778 m), weight 105.3 kg (232 lb 2.3 oz), SpO2 97.00%. Lungs: Clear to auscultation bilaterally. Heart: S1, S2, with no murmurs rubs or gallops. Abdomen: Positive bowel sounds, slightly obese, nontender nondistended. 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 No pedal edema and no pretibial edema. Pedal pulses palpable bilaterally.    Discharge Orders    Future Appointments: Provider: Department: Dept Phone: Center:   10/17/2011 10:30 AM [facility]Cardiopulmonary Svc  None   10/18/2011 10:30 AM Jacolyn Reedy, PA [facility] [phone] [id number]     Future Orders Please Complete By Expires   Diet - low sodium heart healthy      Increase activity slowly      Discharge instructions      Comments:   TRY TO STOP SMOKING. FIND A PRIMARY CARE PHYSICIAN CLOSE TO HOME.       Follow-up Information    Follow up with [facility] on 10/18/2011. (be there at 1030am)       Follow up with [facility] on 10/17/2011. (front destk at 930am for a stress echo)    Contact information:   [address] [place] [address]          Signed: Myrlene Riera 10/11/2011, 2:12 PM

## 2011-10-11 NOTE — Consult Note (Signed)
Reason for Consult:Chest pain Referring Physician: Dr. Elliot Cousin  Alexander Ellison is an 45 y.o. male.  Alexander Ellison is a 45 year old African American male patient admitted yesterday after going to [facility] with chest pain. The patient states that when he smokes 7 or 8 cigarettes an a row he develops a tightness in his chest and if he stops inhaling a cigarette the pain goes away. This has been going on for one or 2 months but has worsened in the past 3 weeks. He plays basketball weekly and denies any exertional chest tightness. He denies radiation of the pain, associated dyspnea, dyspnea on exertion, dizziness, or presyncope.  Patient went to the emergency room approximately 10 years ago with chest pain and was told to get a stress test but never followed up.  Patient's cardiac risk factors are significant for borderline hypertension, untreated. He smokes one pack of cigarettes daily.He denies family history of coronary artery disease, negative for diabetes and hyperlipidemia,  Past Medical History  Diagnosis Date  . Hypertension     not on medication  . Headache     Past Surgical History  Procedure Date  . Knee surgery     right    Family History  Problem Relation Age of Onset  . Diabetes Mother   . Cancer Mother   . Emphysema Father     Social History:  reports that he has been smoking Cigarettes.  He has a 6 pack-year smoking history. He does not have any smokeless tobacco history on file. He reports that he does not drink alcohol or use illicit drugs.  Allergies:  Allergies  Allergen Reactions  . Oxycodone-Acetaminophen     REACTION: Palpatations and Anxiety    Medications:  Results for orders placed during the hospital encounter of 10/10/11 (from the past 48 hour(s))  CBC     Status: Abnormal   Collection Time   10/10/11  5:15 PM      Component Value Range Comment   WBC 5.1  4.0 - 10.5 (K/uL)    RBC 5.30  4.22 - 5.81 (MIL/uL)    Hemoglobin 16.0   13.0 - 17.0 (g/dL)    HCT 45.4  09.8 - 11.9 (%)    MCV 83.6  78.0 - 100.0 (fL)    MCH 30.2  26.0 - 34.0 (pg)    MCHC 36.1 (*) 30.0 - 36.0 (g/dL)    RDW 14.7  82.9 - 56.2 (%)    Platelets 173  150 - 400 (K/uL)   DIFFERENTIAL     Status: Normal   Collection Time   10/10/11  5:15 PM      Component Value Range Comment   Neutrophils Relative 59  43 - 77 (%)    Neutro Abs 3.0  1.7 - 7.7 (K/uL)    Lymphocytes Relative 30  12 - 46 (%)    Lymphs Abs 1.5  0.7 - 4.0 (K/uL)    Monocytes Relative 9  3 - 12 (%)    Monocytes Absolute 0.5  0.1 - 1.0 (K/uL)    Eosinophils Relative 2  0 - 5 (%)    Eosinophils Absolute 0.1  0.0 - 0.7 (K/uL)    Basophils Relative 1  0 - 1 (%)    Basophils Absolute 0.0  0.0 - 0.1 (K/uL)   COMPREHENSIVE METABOLIC PANEL     Status: Abnormal   Collection Time   10/10/11  5:15 PM      Component Value Range Comment   Sodium  135  135 - 145 (mEq/L)    Potassium 3.4 (*) 3.5 - 5.1 (mEq/L)    Chloride 100  96 - 112 (mEq/L)    CO2 25  19 - 32 (mEq/L)    Glucose, Bld 147 (*) 70 - 99 (mg/dL)    BUN 9  6 - 23 (mg/dL)    Creatinine, Ser 1.61  0.50 - 1.35 (mg/dL)    Calcium 9.1  8.4 - 10.5 (mg/dL)    Total Protein 6.9  6.0 - 8.3 (g/dL)    Albumin 3.8  3.5 - 5.2 (g/dL)    AST 19  0 - 37 (U/L)    ALT 27  0 - 53 (U/L)    Alkaline Phosphatase 79  39 - 117 (U/L)    Total Bilirubin 0.4  0.3 - 1.2 (mg/dL)    GFR calc non Af Amer 86 (*) >90 (mL/min)    GFR calc Af Amer >90  >90 (mL/min)   LIPASE, BLOOD     Status: Normal   Collection Time   10/10/11  5:15 PM      Component Value Range Comment   Lipase 19  11 - 59 (U/L)   URINALYSIS, ROUTINE W REFLEX MICROSCOPIC     Status: Normal   Collection Time   10/10/11  6:11 PM      Component Value Range Comment   Color, Urine YELLOW  YELLOW     Appearance CLEAR  CLEAR     Specific Gravity, Urine 1.010  1.005 - 1.030     pH 5.5  5.0 - 8.0     Glucose, UA NEGATIVE  NEGATIVE (mg/dL)    Hgb urine dipstick NEGATIVE  NEGATIVE      Bilirubin Urine NEGATIVE  NEGATIVE     Ketones, ur NEGATIVE  NEGATIVE (mg/dL)    Protein, ur NEGATIVE  NEGATIVE (mg/dL)    Urobilinogen, UA 0.2  0.0 - 1.0 (mg/dL)    Nitrite NEGATIVE  NEGATIVE     Leukocytes, UA NEGATIVE  NEGATIVE  MICROSCOPIC NOT DONE ON URINES WITH NEGATIVE PROTEIN, BLOOD, LEUKOCYTES, NITRITE, OR GLUCOSE <1000 mg/dL.  POCT I-STAT TROPONIN I     Status: Normal   Collection Time   10/10/11  7:21 PM      Component Value Range Comment   Troponin i, poc 0.00  0.00 - 0.08 (ng/mL)    Comment 3            CARDIAC PANEL(CRET KIN+CKTOT+MB+TROPI)     Status: Normal   Collection Time   10/11/11 12:13 AM      Component Value Range Comment   Total CK 109  7 - 232 (U/L)    CK, MB 1.6  0.3 - 4.0 (ng/mL)    Troponin I <0.30  <0.30 (ng/mL)    Relative Index 1.5  0.0 - 2.5    CARDIAC PANEL(CRET KIN+CKTOT+MB+TROPI)     Status: Normal   Collection Time   10/11/11  8:04 AM      Component Value Range Comment   Total CK 99  7 - 232 (U/L)    CK, MB 1.5  0.3 - 4.0 (ng/mL)    Troponin I <0.30  <0.30 (ng/mL)    Relative Index RELATIVE INDEX IS INVALID  0.0 - 2.5    LIPID PANEL     Status: Abnormal   Collection Time   10/11/11  8:10 AM      Component Value Range Comment   Cholesterol 159  0 - 200 (mg/dL)  Triglycerides 161 (*) <150 (mg/dL)    HDL 44  >45 (mg/dL)    Total CHOL/HDL Ratio 3.6      VLDL 32  0 - 40 (mg/dL)    LDL Cholesterol 83  0 - 99 (mg/dL)   BASIC METABOLIC PANEL     Status: Abnormal   Collection Time   10/11/11  8:12 AM      Component Value Range Comment   Sodium 139  135 - 145 (mEq/L)    Potassium 4.2  3.5 - 5.1 (mEq/L) DELTA CHECK NOTED   Chloride 105  96 - 112 (mEq/L)    CO2 26  19 - 32 (mEq/L)    Glucose, Bld 109 (*) 70 - 99 (mg/dL)    BUN 9  6 - 23 (mg/dL)    Creatinine, Ser 4.09  0.50 - 1.35 (mg/dL)    Calcium 8.9  8.4 - 10.5 (mg/dL)    GFR calc non Af Amer >90  >90 (mL/min)    GFR calc Af Amer >90  >90 (mL/min)   CBC     Status: Normal   Collection  Time   10/11/11  8:12 AM      Component Value Range Comment   WBC 4.0  4.0 - 10.5 (K/uL)    RBC 5.25  4.22 - 5.81 (MIL/uL)    Hemoglobin 15.6  13.0 - 17.0 (g/dL)    HCT 81.1  91.4 - 78.2 (%)    MCV 84.2  78.0 - 100.0 (fL)    MCH 29.7  26.0 - 34.0 (pg)    MCHC 35.3  30.0 - 36.0 (g/dL)    RDW 95.6  21.3 - 08.6 (%)    Platelets 162  150 - 400 (K/uL)     Dg Chest 2 View  10/10/2011  *RADIOLOGY REPORT*  Clinical Data: Chest pain, smoker.  CHEST - 2 VIEW  Comparison: None  Findings: Heart and mediastinal contours are within normal limits. No focal opacities or effusions.  No acute bony abnormality.  IMPRESSION: No active disease.  Original Report Authenticated By: Cyndie Chime, M.D.    ROS See HPI Eyes: recent headaches and sinus pain above his left eye Ears:Negative for hearing loss, tinnitus Cardiovascular: Negative for palpitations,irregular heartbeat, dyspnea, dyspnea on exertion, near-syncope, orthopnea, paroxysmal nocturnal dyspnia and syncope,edema, claudication, cyanosis,.  Respiratory:   Negative for cough, hemoptysis, shortness of breath, sleep disturbances due to breathing, sputum production and wheezing.   Endocrine: Negative for cold intolerance and heat intolerance.  Hematologic/Lymphatic: Negative for adenopathy and bleeding problem. Does not bruise/bleed easily.  Musculoskeletal: left knee pain.   Gastrointestinal:complains of burning up into his throat every time he lays down at night relieved with TUMS. He denies indigestion any other time a day. He does eat late at night. Negative for nausea, vomiting,  abdominal pain, diarrhea, constipation.   Neurological: headaches.  Allergic/Immunologic: Negative for environmental allergies.  Blood pressure 120/81, pulse 61, temperature 97.6 F (36.4 C), temperature source Oral, resp. rate 20, height 5\' 10"  (1.778 m), weight 232 lb 2.3 oz (105.3 kg), SpO2 97.00%.  Physical Exam  Vitals reviewed. Constitutional: He is oriented  to person, place, and time. He appears well-nourished. No distress.  HENT:  Head: Normocephalic and atraumatic.  Eyes: Conjunctivae and EOM are normal. Pupils are equal, round, and reactive to light. No scleral icterus.  Neck: Normal range of motion. Neck supple. No JVD present. No thyromegaly present.  Cardiovascular: Normal rate and regular rhythm.  Exam reveals gallop. Exam reveals  no friction rub.   No murmur heard. Respiratory: Effort normal and breath sounds normal. No respiratory distress. He has no wheezes. He has no rales. He exhibits no tenderness.  GI: Soft. Bowel sounds are normal. He exhibits no distension and no mass. There is no tenderness. There is no rebound and no guarding.  Musculoskeletal: Normal range of motion.  Neurological: He is alert and oriented to person, place, and time.  Skin: Skin is warm and dry. He is not diaphoretic.   YQM:VHQION sinus rhythm with T wave inversion in 3 and aVR  Assessment/Plan: Chest pain only after excessive cigarette smoking. Cardiac enzymes are negative. Can proceed with outpatient stress Myoview.Will check 2-D echo for LV function and to rule out LVH Borderline hypertension blood pressure is stable today needs outpatient followup GERD Smoker: advised to quit  Jacolyn Reedy 10/11/2011, 11:10 AM

## 2011-10-11 NOTE — Consult Note (Signed)
Patient seen and examined. Discussed with Ms. Alexander Ellison, and reviewed her recorded database. Mr. Alexander Ellison is a 45 year old male with reported history of hypertension, although on no specific medical therapy, also ongoing tobacco use. He presents with a history of chest heaviness, worse when he is smoking cigarettes, also noted other times, although not specifically with exertion which includes playing basketball.  He was admitted for observation and has ruled out for myocardial infarction, ECG is nonspecific with borderline voltage. Blood pressure is actually normal with systolic of 120, and lipid profile shows LDL cholesterol of 83, HDL 44. His Framingham 10 year risk score is only 6%.  He reportedly presented with chest pain symptoms 10 years ago, and it was recommended for him to have a followup stress test, although he has never pursued this. He states that he sees a health care provider intermittently in [place].  Today we discussed the importance of smoking cessation, and plan to arrange a followup exercise echocardiogram as an outpatient, with office visit to review the results. Plan was discussed with the hospitalist team.

## 2011-10-12 ENCOUNTER — Encounter: Payer: Self-pay | Admitting: Physician Assistant

## 2011-10-12 LAB — URINE CULTURE

## 2011-10-17 ENCOUNTER — Ambulatory Visit (HOSPITAL_COMMUNITY)
Admission: RE | Admit: 2011-10-17 | Discharge: 2011-10-17 | Disposition: A | Discharge status: Home / Self Care | Payer: BC Managed Care – PPO | Source: Ambulatory Visit | Attending: Cardiology | Admitting: Cardiology

## 2011-10-17 DIAGNOSIS — R072 Precordial pain: Secondary | ICD-10-CM

## 2011-10-17 DIAGNOSIS — R079 Chest pain, unspecified: Secondary | ICD-10-CM | POA: Insufficient documentation

## 2011-10-17 DIAGNOSIS — I10 Essential (primary) hypertension: Secondary | ICD-10-CM | POA: Insufficient documentation

## 2011-10-17 NOTE — Progress Notes (Signed)
Stress Lab Nurses Notes - Alexander Ellison  Alexander Ellison 10/17/2011  Reason for doing test: Chest Pain  Type of test: Stress Echo  Nurse performing test: Parke Poisson, RN  Nuclear Medicine Tech: Not Applicable  Echo Tech: Karrie Doffing  MD performing test: Ival Bible & Joni Reining, NP  Family MD: Sherrie Mustache  Test explained and consent signed: yes  IV started: No IV started  Symptoms: SOB  Treatment/Intervention: None  Reason test stopped: reached target HR and SOB  After recovery IV was: NA  Patient to return to Nuc. Med at : NA  Patient discharged: Home  Patient's Condition upon discharge was: stable  Comments: During test peak BP 178/82 & HR 151.  Recovery BP 148/78 & HR 95.  Symptoms resolved in recovery.  [name] T

## 2011-10-17 NOTE — Progress Notes (Signed)
*  PRELIMINARY RESULTS* Echocardiogram Echocardiogram Stress Test has been performed.  Alexander Ellison 10/17/2011, 10:36 AM

## 2011-10-18 ENCOUNTER — Encounter: Payer: Self-pay | Admitting: Physician Assistant

## 2011-10-18 ENCOUNTER — Ambulatory Visit (INDEPENDENT_AMBULATORY_CARE_PROVIDER_SITE_OTHER): Payer: BC Managed Care – PPO | Admitting: Physician Assistant

## 2011-10-18 ENCOUNTER — Encounter: Payer: Self-pay | Admitting: *Deleted

## 2011-10-18 ENCOUNTER — Encounter: Payer: BC Managed Care – PPO | Admitting: Physician Assistant

## 2011-10-18 DIAGNOSIS — I10 Essential (primary) hypertension: Secondary | ICD-10-CM

## 2011-10-18 DIAGNOSIS — F17200 Nicotine dependence, unspecified, uncomplicated: Secondary | ICD-10-CM

## 2011-10-18 DIAGNOSIS — K219 Gastro-esophageal reflux disease without esophagitis: Secondary | ICD-10-CM

## 2011-10-18 DIAGNOSIS — Z720 Tobacco use: Secondary | ICD-10-CM

## 2011-10-18 DIAGNOSIS — R079 Chest pain, unspecified: Secondary | ICD-10-CM

## 2011-10-18 NOTE — Patient Instructions (Signed)
Your physician recommends that you schedule a follow-up appointment in: As needed only  Obtain a Primary Care Physician and Gastroenterologist  STOP Smoking

## 2011-10-18 NOTE — Assessment & Plan Note (Addendum)
Chest pain has improved. I reviewed his stress echo that was performed yesterday, and was normal. Patient can follow-up with [place] p.r.n. We will need to get his refills from his primary care physician.

## 2011-10-18 NOTE — Assessment & Plan Note (Signed)
Smoking cessation was again discussed in detail. He continues to smoke a couple cigarettes a day as well as electronic cigarettes.

## 2011-10-18 NOTE — Assessment & Plan Note (Signed)
Patient's blood pressure is controlled on his current medications. I asked him to follow-up with her primary medical doctor. He says he usually sees Dr. Harland Dingwall at [facility] care.

## 2011-10-18 NOTE — Assessment & Plan Note (Signed)
Patient continues to have breakthrough symptoms of gastroesophageal reflux disease as well as right red blood per rectum when he strains. I encouraged him to see a gastroenterologist. He is reluctant at this time.

## 2011-10-18 NOTE — Progress Notes (Signed)
HPI: This is a 45 year old African American male patient who I saw in the hospital with Dr. Diona Browner for atypical chest pain. He would get chest pain after he smokes 7 or 8 cigarettes. He ruled out for an MI. He had a stress echo yesterday that showed normal LV function ejection fraction 60-65%. There was no echocardiographic evidence of stress-induced ischemia.  The patient has an occasional twinge in his left chest but nothing like he had been having before. He is switched to electronic cigarette smoking and has decreased the amount he smokes.  The patient continues to have gastroesophageal reflux symptoms although they have improved some on the Protonix. He does use Pepto-Bismol and TUMS throughout the day and evening.  Allergies  Allergen Reactions  . Oxycodone-Acetaminophen     REACTION: Palpatations and Anxiety    Current Outpatient Prescriptions on File Prior to Visit  Medication Sig Dispense Refill  . aspirin EC 81 MG tablet Take 1 tablet (81 mg total) by mouth daily.      Marland Kitchen lisinopril (ZESTRIL) 5 MG tablet Take 2 tablets (10 mg total) by mouth daily.  30 tablet  2  . Multiple Vitamins-Minerals (MULTIVITAMINS THER. W/MINERALS) TABS Take 1 tablet by mouth daily.        . pantoprazole (PROTONIX) 40 MG tablet Take 1 tablet (40 mg total) by mouth daily. FOR ACID REFLUX.  30 tablet  2    Past Medical History  Diagnosis Date  . Hypertension     not on medication  . Headache     Past Surgical History  Procedure Date  . Knee surgery     right    Family History  Problem Relation Age of Onset  . Diabetes Mother   . Cancer Mother   . Emphysema Father     History   Social History  . Marital Status: Single    Spouse Name: N/A    Number of Children: N/A  . Years of Education: N/A   Occupational History  . Not on file.   Social History Main Topics  . Smoking status: Current Everyday Smoker -- 1.0 packs/day for 6 years    Types: Cigarettes  . Smokeless tobacco: Not on  file  . Alcohol Use: No  . Drug Use: No  . Sexually Active:    Other Topics Concern  . Not on file   Social History Narrative  . No narrative on file    ROS: See HPI Eyes: Negative Ears:Negative for hearing loss, tinnitus Cardiovascular: Negative for palpitations,irregular heartbeat, dyspnea, dyspnea on exertion, near-syncope, orthopnea, paroxysmal nocturnal dyspnia and syncope,edema, claudication, cyanosis,.  Respiratory:   Negative for cough, hemoptysis, shortness of breath, sleep disturbances due to breathing, sputum production and wheezing.   Endocrine: Negative for cold intolerance and heat intolerance.  Hematologic/Lymphatic: Negative for adenopathy and bleeding problem. Does not bruise/bleed easily.  Musculoskeletal: Negative.   Gastrointestinal: positive for reflux although has improved with Protonix, also has bright red blood per rectum when he strains.Negative for nausea, vomiting,  abdominal pain, diarrhea.   Neurological: Negative.  Allergic/Immunologic: Negative for environmental allergies.   PHYSICAL EXAM: Well-nournished, in no acute distress. Neck: No JVD, HJR, Bruit, or thyroid enlargement Lungs: No tachypnea, clear without wheezing, rales, or rhonchi Cardiovascular: RRR, PMI not displaced, heart sounds normal, no murmurs, gallops, bruit, thrill, or heave. Abdomen: BS normal. Soft without organomegaly, masses, lesions or tenderness. Extremities: without cyanosis, clubbing or edema. Good distal pulses bilateral SKin: Warm, no lesions or rashes  Musculoskeletal: No  deformities Neuro: no focal signs  BP 129/81  Pulse 65  Ht 5\' 10"  (1.778 m)  Wt 236 lb (107.049 kg)  BMI 33.86 kg/m2

## 2012-01-05 ENCOUNTER — Other Ambulatory Visit: Payer: Self-pay | Admitting: Internal Medicine

## 2012-01-11 ENCOUNTER — Encounter (HOSPITAL_COMMUNITY): Payer: Self-pay | Admitting: Cardiology

## 2014-11-13 ENCOUNTER — Emergency Department (HOSPITAL_COMMUNITY)
Admission: EM | Admit: 2014-11-13 | Discharge: 2014-11-13 | Disposition: A | Discharge status: Home / Self Care | Payer: BC Managed Care – PPO | Attending: Emergency Medicine | Admitting: Emergency Medicine

## 2014-11-13 ENCOUNTER — Other Ambulatory Visit: Payer: Self-pay

## 2014-11-13 ENCOUNTER — Encounter (HOSPITAL_COMMUNITY): Payer: Self-pay | Admitting: Emergency Medicine

## 2014-11-13 ENCOUNTER — Emergency Department (HOSPITAL_COMMUNITY): Payer: BC Managed Care – PPO

## 2014-11-13 DIAGNOSIS — K219 Gastro-esophageal reflux disease without esophagitis: Secondary | ICD-10-CM | POA: Diagnosis not present

## 2014-11-13 DIAGNOSIS — I10 Essential (primary) hypertension: Secondary | ICD-10-CM | POA: Insufficient documentation

## 2014-11-13 DIAGNOSIS — Z7982 Long term (current) use of aspirin: Secondary | ICD-10-CM | POA: Diagnosis not present

## 2014-11-13 DIAGNOSIS — R0789 Other chest pain: Secondary | ICD-10-CM | POA: Diagnosis not present

## 2014-11-13 DIAGNOSIS — Z79899 Other long term (current) drug therapy: Secondary | ICD-10-CM | POA: Insufficient documentation

## 2014-11-13 DIAGNOSIS — R079 Chest pain, unspecified: Secondary | ICD-10-CM

## 2014-11-13 DIAGNOSIS — Z720 Tobacco use: Secondary | ICD-10-CM | POA: Insufficient documentation

## 2014-11-13 HISTORY — DX: Reserved for inherently not codable concepts without codable children: IMO0001

## 2014-11-13 HISTORY — DX: Gastro-esophageal reflux disease without esophagitis: K21.9

## 2014-11-13 LAB — CBC WITH DIFFERENTIAL/PLATELET
BASOS PCT: 0 % (ref 0–1)
Basophils Absolute: 0 10*3/uL (ref 0.0–0.1)
EOS ABS: 0 10*3/uL (ref 0.0–0.7)
Eosinophils Relative: 1 % (ref 0–5)
HCT: 44.3 % (ref 39.0–52.0)
Hemoglobin: 16.1 g/dL (ref 13.0–17.0)
Lymphocytes Relative: 34 % (ref 12–46)
Lymphs Abs: 1.3 10*3/uL (ref 0.7–4.0)
MCH: 30.4 pg (ref 26.0–34.0)
MCHC: 36.3 g/dL — AB (ref 30.0–36.0)
MCV: 83.6 fL (ref 78.0–100.0)
Monocytes Absolute: 0.5 10*3/uL (ref 0.1–1.0)
Monocytes Relative: 13 % — ABNORMAL HIGH (ref 3–12)
NEUTROS PCT: 52 % (ref 43–77)
Neutro Abs: 2 10*3/uL (ref 1.7–7.7)
PLATELETS: 170 10*3/uL (ref 150–400)
RBC: 5.3 MIL/uL (ref 4.22–5.81)
RDW: 12.6 % (ref 11.5–15.5)
WBC: 3.8 10*3/uL — ABNORMAL LOW (ref 4.0–10.5)

## 2014-11-13 LAB — BASIC METABOLIC PANEL
Anion gap: 12 (ref 5–15)
BUN: 11 mg/dL (ref 6–23)
CALCIUM: 9.1 mg/dL (ref 8.4–10.5)
CO2: 25 mEq/L (ref 19–32)
CREATININE: 1.17 mg/dL (ref 0.50–1.35)
Chloride: 103 mEq/L (ref 96–112)
GFR, EST AFRICAN AMERICAN: 83 mL/min — AB (ref >90)
GFR, EST NON AFRICAN AMERICAN: 72 mL/min — AB (ref >90)
Glucose, Bld: 84 mg/dL (ref 70–99)
POTASSIUM: 4.2 meq/L (ref 3.7–5.3)
Sodium: 140 mEq/L (ref 137–147)

## 2014-11-13 LAB — TROPONIN I

## 2014-11-13 MED ORDER — METHOCARBAMOL 500 MG PO TABS: 1000.0000 mg | ORAL_TABLET | Freq: Four times a day (QID) | ORAL | Status: DC | PRN

## 2014-11-13 MED ORDER — ACETAMINOPHEN 500 MG PO TABS
1000.0000 mg | ORAL_TABLET | Freq: Once | ORAL | Status: AC
  Administered 2014-11-13: 1000 mg via ORAL

## 2014-11-13 MED ORDER — IBUPROFEN 400 MG PO TABS
400.0000 mg | ORAL_TABLET | Freq: Once | ORAL | Status: AC
  Administered 2014-11-13: 400 mg via ORAL

## 2014-11-13 MED ORDER — HYDROCODONE-ACETAMINOPHEN 5-325 MG PO TABS: ORAL_TABLET | ORAL | Status: AC

## 2014-11-13 NOTE — ED Notes (Signed)
Patient with no complaints at this time. Respirations even and unlabored. Skin warm/dry. Discharge instructions reviewed with patient at this time. Patient given opportunity to voice concerns/ask questions. Patient discharged at this time and left Emergency Department with steady gait.   

## 2014-11-13 NOTE — Discharge Instructions (Signed)
°Emergency Department Resource Guide °1) Find a Doctor and Pay Out of Pocket °Although you won't have to find out who is covered by your insurance plan, it is a good idea to ask around and get recommendations. You will then need to call the office and see if the doctor you have chosen will accept you as a new patient and what types of options they offer for patients who are self-pay. Some doctors offer discounts or will set up payment plans for their patients who do not have insurance, but you will need to ask so you aren't surprised when you get to your appointment. ° °2) Contact Your Local Health Department °Not all health departments have doctors that can see patients for sick visits, but many do, so it is worth a call to see if yours does. If you don't know where your local health department is, you can check in your phone book. The CDC also has a tool to help you locate your state's health department, and many state websites also have listings of all of their local health departments. ° °3) Find a Walk-in Clinic °If your illness is not likely to be very severe or complicated, you may want to try a walk in clinic. These are popping up all over the country in pharmacies, drugstores, and shopping centers. They're usually staffed by nurse practitioners or physician assistants that have been trained to treat common illnesses and complaints. They're usually fairly quick and inexpensive. However, if you have serious medical issues or chronic medical problems, these are probably not your best option. ° °No Primary Care Doctor: °- Call [facility] at  832-8000 - they can help you locate a primary care doctor that  accepts your insurance, provides certain services, etc. °- Physician Referral Service- 1-800-533-3463 ° °Chronic Pain Problems: °Organization         Address  Phone   Notes  °[facility]  (336) 297-2271 Patients need to be referred by their primary care doctor.  ° °Medication  Assistance: °Organization         Address  Phone   Notes  °[place] [facility] [address] °[place], [place] [address] (336) 641-8030 --Must be a resident of [place] °-- Must have NO insurance coverage whatsoever (no Medicaid/ Medicare, etc.) °-- The pt. MUST have a primary care doctor that directs their care regularly and follows them in the community °  °[facility]  (866) 331-1348   °[employer]  (888) 892-1162   ° °Agencies that provide inexpensive medical care: °Organization         Address  Phone   Notes  °[facility]  (336) 832-8035   °[facility]    (336) 832-7272   °[facility] [address] °[place], [place] [address] (336) 832-4777   °[facility] [address], °[place] (336) 271-4999   °[facility]    (336) 373-0678   °[facility]    (336) 272-1050   °[facility] ° [address], [place] Phone:  (336) 832-4444, Fax:  (336) 832-4440 Hours of Operation:  9 am - 6 pm, M-F.  Also accepts Medicaid/Medicare and self-pay.  °[facility] ° [address], [place] Phone: (336) 832-3150, Fax: (336) [phone]. Hours of Operation:  8:30 am - 5:30 pm, M-F.  Also accepts Medicaid and self-pay.  °[facility] [place] [address], [place] Phone: (336) 878-6027   °[facility] [address], [place], [place] (336)723-1848, Ext. 123 Mondays & Thursdays: 7-9 AM.  First 15 patients are seen on a first come, first serve basis. °  ° °Medicaid-accepting [place] Providers: ° °Organization         Address  Phone   Notes  °[facility] [address], [place] (336) 641-2100 Also accepts self-pay patients.  °[facility] [address], [place] ° (336) 856-9996   °[facility] [address], [place]  (336) 288-8857   °[facility] [address] [place] [address], [place] (336) 299-7000   °Veita Bland [address], [place]  ° (336) 373-1557 Only accepts [place] Access Medicaid patients after they have their name applied to their card.  ° °Self-Pay (no insurance) in [place]: ° °Organization         Address  Phone   Notes  °Sickle Cell Patients, [facility] [address], [place] (336) 832-1970   °[facility] [address], [place] (336) 832-4400   °[facility] ° [address], [place] (336) 992-4800   °[facility]/Dr. Osei-Bonsu ° [address] [place] [address], [place] or [address], [place] (336) 841-8500 Phone number for both [place] and [place] locations is the same.  °[facility] [address], [place] (336) 299-0000   °[facility] [address] [place] [address], [place] or [address] (336) 852-7530 °(336) 878-2260   °[facility] [address], [place] (336) 350-1642, phone; (336) 294-5005, fax Sees patients 1st and 3rd Saturday of every month.  Must not qualify for public or private insurance (i.e. Medicaid, Medicare, [place] Health Choice, Veterans' Benefits) • Household income should be no more than 200% of the poverty level •The clinic cannot treat you if you are pregnant or think you are pregnant • Sexually transmitted diseases are not treated at the clinic.  ° ° °Dental Care: °Organization         Address  Phone  Notes  °[place] [facility] [address], [place] (336) 641-6152 Accepts children up to age 21 who are enrolled in Medicaid or [place] Health Choice; pregnant women with a Medicaid card; and children who have applied for Medicaid or [place] Health Choice, but were declined, whose parents can pay a reduced fee at time of service.  °[place]  [facility] [place]  [address], [place] (336) 641-7733 Accepts children up to age 21 who are enrolled in Medicaid or [place] Health Choice; pregnant women with a Medicaid card; and children who have applied for Medicaid or [place] Health Choice, but were declined, whose parents can pay a reduced fee at time of service.  °[facility] ° [address], [place] (336) 641-4533 Patients are seen by appointment only. Walk-ins are not accepted. [facility] will see patients 18 years of age and older. °Monday - Tuesday (8am-5pm) °Most Wednesdays (8:30-5pm) °$30 per visit, cash only  °[facility] ° [address], [place] (336) 641-4533 Patients are seen by appointment only. Walk-ins are not accepted. [facility] will see patients 18 years of age and older. °One   Wednesday Evening (Monthly: Volunteer Based).  $30 per visit, cash only  °[facility]  (919) 537-3737 for adults; Children under age 4, call Graduate Pediatric Dentistry at ([phone]) [phone]. Children aged 4-14, please call ([phone]) [phone] to request a pediatric application. ° Dental services are provided in all areas of dental care including fillings, crowns and bridges, complete and partial dentures, implants, gum treatment, root canals, and extractions. Preventive care is also provided. Treatment is provided to both adults and children. °Patients are selected via a lottery and there is often a waiting list. °  °[facility] [address], °[place] ° (336) 763-8833 [email or web address] °  °[facility] [address], [place], [place] (336)723-1848, Ext. 123 Second and Fourth Thursday of each month, opens at 6:30 AM; Clinic ends at 9 AM.  Patients are seen on a first-come first-served basis, and a limited number are seen during each clinic.  ° °[facility] ° [address], [place], [place] (336) 723-7904    Eligibility Requirements °You must have lived in [place], [place], or [place] counties for at least the last three months. °  You cannot be eligible for state or federal sponsored healthcare insurance, including Veterans Administration, Medicaid, or Medicare. °  You generally cannot be eligible for healthcare insurance through your employer.  °  How to apply: °Eligibility screenings are held every Tuesday and Wednesday afternoon from 1:00 pm until 4:00 pm. You do not need an appointment for the interview!  °[facility] [address], [place], [place] 336-631-2330   °[facility]  336-342-8273   °[facility]  336-703-3100   °[facility]  336-570-6415   ° °Behavioral Health Resources in the Community: °Intensive Outpatient Programs °Organization         Address  Phone  Notes  °[facility] [address], [place], [place] 336-878-6098   °[facility] [address], [place], [place] 336-832-9800   °[facility] [address], [place], [place] ° 336-882-2125   °[facility] [address],  °[place], [place] 1-800-853-5163 or 336-641-4981   °Substance Abuse Resources °Organization         Address  Phone  Notes  °[facility]  336-882-2125   °[facility]  336-784-9470   °[facility]  336-285-9073   °[facility]  336-845-3988   °Residential & Outpatient Substance Abuse Program  1-800-659-3381   °Psychological Services °Organization         Address  Phone  Notes  °[facility]  336- 832-9600   °[facility]  336- 378-7881   °[facility] [address], [place] 1-800-853-5163 or 336-641-4981   ° °Mobile Crisis Teams °Organization         Address  Phone  Notes  °[facility], Mobile Crisis Care Unit  1-877-626-1772   °Assertive °[facility] ° [address]  [place], [place] 336-834-9664   °Sharon DeEsch [address] °[place] 336-554-5454   ° °Self-Help/Support Groups °Organization         Address  Phone             Notes  °[facility]  - variety of support groups  336- 373-1402 Call for more information  °Narcotics Anonymous (NA), [facility] [address], °[place]  2 meetings at this location  ° °  Residential Treatment Programs Organization         Address  Phone  Notes  [facility] [address],    [place]  [phone]   [facility]  [address], [place] [phone], [place], [place] [phone]   [facility] [address], [place] [phone] Admissions: 8am-3pm M-F  [facility] [address]    [place], [place] [phone]   [facility] [address], [place], [place] [phone]   [facility] [address]  [place], [place] [phone]   [facility] Intensive Outpatient [address], [place], [place] [phone]   [facility] [address]  [place], [place] [phone] or [phone](516)[phone]   [facility] [address] [place], [place] [phone] Accepts Medicaid  [facility] [address]  [place] [phone] Substance Abuse/Addiction Treatment   [facility] Organization         Address  Phone  Notes  [facility]  226-626-0256(888) (217)020-9885   Angie FavaJulie Brannon, PhD [address], [address] [place], [place]   ([phone](336) [phone] or [phone](336) [phone]   [facility]   [address] [place], [place] [phone](336) ([phone]   [facility] [address], [place], [place] [phone](336) [phone] Insurance/Medicaid/sponsorship through [facility]  [facility] [address]                                    [place], [place] [phone]) [phone] Therapy/tele-psych/case    [facility] [address]   [place], [place] ([phone]) [phone](336) [phone]    Dr. Lolly MustacheArfeen  ([phone]) [phone](336) [phone]   [facility]  [employer] [facility] 1) [address], [place] 2) [address], [place] 3)  [address], [place] [phone](336) ([phone]) [phone](336) ([phone]) [phone]  [phone](336) [phone]   [facility] Child Abuse Hotline (423) 754-4831(336) 6474690917 or 7877612100(336) 204-625-9004 (After Hours)      Take the prescriptions as directed.  Apply moist heat or ice to the area(s) of discomfort, for 15 minutes at a time, several times per day for the next few days.  Do not fall asleep on a heating or ice pack.  Call your regular medical doctor and the Cardiologist today to schedule a follow up appointment in the next 3 to 4 days.  Return to the Emergency Department immediately if worsening.

## 2014-11-13 NOTE — ED Provider Notes (Signed)
CSN: [id number]     Arrival date & time 11/13/14  1004 History   First MD Initiated Contact with Patient 11/13/14 1028     Chief Complaint  Patient presents with  . Chest Pain      HPI Pt was seen at 1035. Per pt, c/o gradual onset and persistence of constant chest wall "pain" for the past 2 weeks. Pt states the pain began "after I did some dips on a fitness test." Describes the CP as "dull" and "aching." Pt has not taken any meds for pain. States this pain has been there constantly over the past 2 weeks. Pt also has had fleeting, randomly moving, "sharp" chest pains all over his anterior chest over the past 2 weeks; these pains last only seconds each episode and generally occur when he moves his arms (ie: grab the steering wheel to driving the car). Pt states has been performing yard work (raking leaves, mowing the lawn) without any chest discomfort. Denies any associated symptoms. Denies palpitations, no SOB/cough, no abd pain, no N/V/D, no fevers, no back pain, no direct injury, no rash.    Past Medical History  Diagnosis Date  . Hypertension     not on medication  . Headache(784.0)   . GERD (gastroesophageal reflux disease)   . Normal cardiac stress test 2012    normal stress echo   Past Surgical History  Procedure Laterality Date  . Knee surgery      right   Family History  Problem Relation Age of Onset  . Diabetes Mother   . Cancer Mother   . Emphysema Father   . Heart disease Neg Hx    History  Substance Use Topics  . Smoking status: Current Every Day Smoker -- 1.00 packs/day for 6 years    Types: Cigarettes  . Smokeless tobacco: Not on file  . Alcohol Use: No    Review of Systems ROS: Statement: All systems negative except as marked or noted in the HPI; Constitutional: Negative for fever and chills. ; ; Eyes: Negative for eye pain, redness and discharge. ; ; ENMT: Negative for ear pain, hoarseness, nasal congestion, sinus pressure and sore throat. ; ;  Cardiovascular: Negative for palpitations, diaphoresis, dyspnea and peripheral edema. ; ; Respiratory: Negative for cough, wheezing and stridor. ; ; Gastrointestinal: Negative for nausea, vomiting, diarrhea, abdominal pain, blood in stool, hematemesis, jaundice and rectal bleeding. . ; ; Genitourinary: Negative for dysuria, flank pain and hematuria. ; ; Musculoskeletal: +chest wall pain. Negative for back pain and neck pain. Negative for swelling and trauma.; ; Skin: Negative for pruritus, rash, abrasions, blisters, bruising and skin lesion.; ; Neuro: Negative for headache, lightheadedness and neck stiffness. Negative for weakness, altered level of consciousness , altered mental status, extremity weakness, paresthesias, involuntary movement, seizure and syncope.     Allergies  Oxycodone-acetaminophen  Home Medications   Prior to Admission medications   Medication Sig Start Date End Date Taking? Authorizing Provider  aspirin EC 81 MG tablet Take 81 mg by mouth daily.   Yes Historical Provider, MD  ibuprofen (ADVIL,MOTRIN) 200 MG tablet Take 800 mg by mouth every 6 (six) hours as needed for moderate pain.   Yes Historical Provider, MD  lisinopril (PRINIVIL,ZESTRIL) 10 MG tablet Take 10 mg by mouth daily. 09/11/14  Yes Historical Provider, MD  Multiple Vitamins-Minerals (MULTIVITAMINS THER. W/MINERALS) TABS Take 1 tablet by mouth daily.     Yes Historical Provider, MD  naphazoline-glycerin (CLEAR EYES) 0.012-0.2 % SOLN Place 2 drops  into both eyes daily as needed (red eyes).   Yes Historical Provider, MD  omeprazole (PRILOSEC) 20 MG capsule Take 20 mg by mouth daily.   Yes Historical Provider, MD  lisinopril (ZESTRIL) 5 MG tablet Take 2 tablets (10 mg total) by mouth daily. 10/11/11 10/10/12  Elliot Cousinenise Fisher, MD  pantoprazole (PROTONIX) 40 MG tablet Take 1 tablet (40 mg total) by mouth daily. FOR ACID REFLUX. Patient not taking: Reported on 11/13/2014 10/11/11 10/10/12  Elliot Cousinenise Fisher, MD   BP 140/98  mmHg  Temp(Src) 98.6 F (37 C) (Oral)  Resp 18  SpO2 97% Physical Exam 1040: Physical examination:  Nursing notes reviewed; Vital signs and O2 SAT reviewed;  Constitutional: Well developed, Well nourished, Well hydrated, In no acute distress; Head:  Normocephalic, atraumatic; Eyes: EOMI, PERRL, No scleral icterus; ENMT: Mouth and pharynx normal, Mucous membranes moist; Neck: Supple, Full range of motion, No lymphadenopathy; Cardiovascular: Regular rate and rhythm, No murmur, rub, or gallop; Respiratory: Breath sounds clear & equal bilaterally, No rales, rhonchi, wheezes.  Speaking full sentences with ease, Normal respiratory effort/excursion; Chest: +right and left upper anterior lateral chest wall areas tender to palp. No rash, no soft tissue crepitus, no deformity. Movement normal; Abdomen: Soft, Nontender, Nondistended, Normal bowel sounds; Genitourinary: No CVA tenderness; Extremities: Pulses normal, No tenderness, No edema, No calf edema or asymmetry.; Neuro: AA&Ox3, Major CN grossly intact.  Speech clear. No gross focal motor or sensory deficits in extremities. Climbs on and off stretcher easily by himself. Gait steady.; Skin: Color normal, Warm, Dry.   ED Course  Procedures     EKG Interpretation None      MDM  MDM Reviewed: previous chart, nursing note and vitals Reviewed previous: labs and ECG Interpretation: labs, ECG and x-ray    Date: 11/13/2014  Rate: 83  Rhythm: normal sinus rhythm  QRS Axis: left  Intervals: normal  ST/T Wave abnormalities: normal  Conduction Disutrbances:none  Narrative Interpretation:   Old EKG Reviewed: unchanged; no significant changes from previous EKG dated 10/11/2011.   Results for orders placed or performed during the hospital encounter of 11/13/14  Basic metabolic panel  Result Value Ref Range   Sodium 140 137 - 147 mEq/L   Potassium 4.2 3.7 - 5.3 mEq/L   Chloride 103 96 - 112 mEq/L   CO2 25 19 - 32 mEq/L   Glucose, Bld 84 70 - 99  mg/dL   BUN 11 6 - 23 mg/dL   Creatinine, Ser 1.611.17 0.50 - 1.35 mg/dL   Calcium 9.1 8.4 - 09.610.5 mg/dL   GFR calc non Af Amer 72 (L) >90 mL/min   GFR calc Af Amer 83 (L) >90 mL/min   Anion gap 12 5 - 15  CBC with Differential  Result Value Ref Range   WBC 3.8 (L) 4.0 - 10.5 K/uL   RBC 5.30 4.22 - 5.81 MIL/uL   Hemoglobin 16.1 13.0 - 17.0 g/dL   HCT 04.544.3 40.939.0 - 81.152.0 %   MCV 83.6 78.0 - 100.0 fL   MCH 30.4 26.0 - 34.0 pg   MCHC 36.3 (H) 30.0 - 36.0 g/dL   RDW 91.412.6 78.211.5 - 95.615.5 %   Platelets 170 150 - 400 K/uL   Neutrophils Relative % 52 43 - 77 %   Neutro Abs 2.0 1.7 - 7.7 K/uL   Lymphocytes Relative 34 12 - 46 %   Lymphs Abs 1.3 0.7 - 4.0 K/uL   Monocytes Relative 13 (H) 3 - 12 %   Monocytes Absolute  0.5 0.1 - 1.0 K/uL   Eosinophils Relative 1 0 - 5 %   Eosinophils Absolute 0.0 0.0 - 0.7 K/uL   Basophils Relative 0 0 - 1 %   Basophils Absolute 0.0 0.0 - 0.1 K/uL  Troponin I  Result Value Ref Range   Troponin I <0.30 <0.30 ng/mL   Dg Chest 2 View 11/13/2014   CLINICAL DATA:  Chest pain for 2 weeks  EXAM: CHEST  2 VIEW  COMPARISON:  October 10, 2011  FINDINGS: Lungs are clear. Heart size and pulmonary vascularity are normal. No adenopathy. No pneumothorax. No bone lesions.  IMPRESSION: No edema or consolidation.   Electronically Signed   By: Bretta Bang M.D.   On: 11/13/2014 11:08    1200:  Feels better after meds and wants to go home now. Doubt PE as cause for symptoms with low risk Wells.  Doubt ACS as cause for symptoms with normal troponin and unchanged EKG from previous after 2 weeks of constant symptoms, as well as hx normal cardiac stress test and TIMI 0. Dx and testing d/w pt.  Questions answered.  Verb understanding, agreeable to d/c home with outpt f/u.          Samuel Jester, DO 11/15/14 [phone]

## 2014-11-13 NOTE — ED Notes (Signed)
CXR changed to 2-view.

## 2014-11-13 NOTE — ED Notes (Signed)
Having chest pain for 2 weeks (On and off).  Rates pain 2-3.  Denies SOB.

## 2014-11-13 NOTE — Care Management Note (Signed)
Pt has no PCP. Pt given the handout with [facility] phone number and instructions for calling to receive assistance with finding  PCP who is taking new patients and who will take his insurance. Pt verbalizes understanding, and appreciation for information.

## 2018-01-31 ENCOUNTER — Other Ambulatory Visit: Payer: Self-pay

## 2018-01-31 ENCOUNTER — Emergency Department (HOSPITAL_COMMUNITY)
Admission: EM | Admit: 2018-01-31 | Discharge: 2018-02-01 | Disposition: A | Discharge status: Home / Self Care | Payer: BC Managed Care – PPO | Attending: Emergency Medicine | Admitting: Emergency Medicine

## 2018-01-31 ENCOUNTER — Encounter (HOSPITAL_COMMUNITY): Payer: Self-pay | Admitting: Emergency Medicine

## 2018-01-31 DIAGNOSIS — F17210 Nicotine dependence, cigarettes, uncomplicated: Secondary | ICD-10-CM | POA: Insufficient documentation

## 2018-01-31 DIAGNOSIS — I10 Essential (primary) hypertension: Secondary | ICD-10-CM | POA: Insufficient documentation

## 2018-01-31 DIAGNOSIS — Y939 Activity, unspecified: Secondary | ICD-10-CM | POA: Insufficient documentation

## 2018-01-31 DIAGNOSIS — Z79899 Other long term (current) drug therapy: Secondary | ICD-10-CM | POA: Insufficient documentation

## 2018-01-31 DIAGNOSIS — Y33XXXA Other specified events, undetermined intent, initial encounter: Secondary | ICD-10-CM | POA: Insufficient documentation

## 2018-01-31 DIAGNOSIS — Z7982 Long term (current) use of aspirin: Secondary | ICD-10-CM | POA: Insufficient documentation

## 2018-01-31 DIAGNOSIS — Y998 Other external cause status: Secondary | ICD-10-CM | POA: Insufficient documentation

## 2018-01-31 DIAGNOSIS — S46811A Strain of other muscles, fascia and tendons at shoulder and upper arm level, right arm, initial encounter: Secondary | ICD-10-CM | POA: Diagnosis not present

## 2018-01-31 DIAGNOSIS — R1010 Upper abdominal pain, unspecified: Secondary | ICD-10-CM

## 2018-01-31 DIAGNOSIS — Y929 Unspecified place or not applicable: Secondary | ICD-10-CM | POA: Insufficient documentation

## 2018-01-31 NOTE — ED Triage Notes (Signed)
Pt c/o upper abd pain and right shoulder pain that radiates into neck since yesterday. Pt denies any injury.

## 2018-02-01 ENCOUNTER — Emergency Department (HOSPITAL_COMMUNITY): Payer: BC Managed Care – PPO

## 2018-02-01 LAB — URINALYSIS, ROUTINE W REFLEX MICROSCOPIC
BILIRUBIN URINE: NEGATIVE
Glucose, UA: NEGATIVE mg/dL
Hgb urine dipstick: NEGATIVE
KETONES UR: NEGATIVE mg/dL
LEUKOCYTES UA: NEGATIVE
NITRITE: NEGATIVE
PH: 5.5 (ref 5.0–8.0)
Protein, ur: NEGATIVE mg/dL
Specific Gravity, Urine: 1.02 (ref 1.005–1.030)

## 2018-02-01 LAB — CBC WITH DIFFERENTIAL/PLATELET
BASOS ABS: 0 10*3/uL (ref 0.0–0.1)
BASOS PCT: 0 %
EOS ABS: 0.1 10*3/uL (ref 0.0–0.7)
Eosinophils Relative: 2 %
HEMATOCRIT: 44.3 % (ref 39.0–52.0)
HEMOGLOBIN: 15.4 g/dL (ref 13.0–17.0)
Lymphocytes Relative: 41 %
Lymphs Abs: 2.1 10*3/uL (ref 0.7–4.0)
MCH: 29.4 pg (ref 26.0–34.0)
MCHC: 34.8 g/dL (ref 30.0–36.0)
MCV: 84.7 fL (ref 78.0–100.0)
MONO ABS: 0.5 10*3/uL (ref 0.1–1.0)
Monocytes Relative: 10 %
NEUTROS ABS: 2.3 10*3/uL (ref 1.7–7.7)
NEUTROS PCT: 47 %
Platelets: 165 10*3/uL (ref 150–400)
RBC: 5.23 MIL/uL (ref 4.22–5.81)
RDW: 12.6 % (ref 11.5–15.5)
WBC: 5 10*3/uL (ref 4.0–10.5)

## 2018-02-01 LAB — COMPREHENSIVE METABOLIC PANEL
ALK PHOS: 53 U/L (ref 38–126)
ALT: 22 U/L (ref 17–63)
ANION GAP: 9 (ref 5–15)
AST: 17 U/L (ref 15–41)
Albumin: 3.4 g/dL — ABNORMAL LOW (ref 3.5–5.0)
BILIRUBIN TOTAL: 0.5 mg/dL (ref 0.3–1.2)
BUN: 12 mg/dL (ref 6–20)
CALCIUM: 8.6 mg/dL — AB (ref 8.9–10.3)
CO2: 22 mmol/L (ref 22–32)
Chloride: 108 mmol/L (ref 101–111)
Creatinine, Ser: 1.08 mg/dL (ref 0.61–1.24)
GFR calc Af Amer: >60 mL/min (ref >60)
Glucose, Bld: 101 mg/dL — ABNORMAL HIGH (ref 65–99)
POTASSIUM: 3.9 mmol/L (ref 3.5–5.1)
Sodium: 139 mmol/L (ref 135–145)
TOTAL PROTEIN: 6.4 g/dL — AB (ref 6.5–8.1)

## 2018-02-01 LAB — LIPASE, BLOOD: LIPASE: 23 U/L (ref 11–51)

## 2018-02-01 MED ORDER — ACETAMINOPHEN 325 MG PO TABS
650.0000 mg | ORAL_TABLET | Freq: Once | ORAL | Status: AC
  Administered 2018-02-01: 650 mg via ORAL
  Filled 2018-02-01: qty 2

## 2018-02-01 MED ORDER — SODIUM CHLORIDE 0.9 % IV BOLUS (SEPSIS)
1000.0000 mL | Freq: Once | INTRAVENOUS | Status: AC
  Administered 2018-02-01: 1000 mL via INTRAVENOUS

## 2018-02-01 MED ORDER — FENTANYL CITRATE (PF) 100 MCG/2ML IJ SOLN: 50.0000 ug | Freq: Once | INTRAMUSCULAR | Status: DC

## 2018-02-01 MED ORDER — METHOCARBAMOL 500 MG PO TABS: ORAL_TABLET | ORAL | 0 refills | Status: AC

## 2018-02-01 MED ORDER — FENTANYL CITRATE (PF) 100 MCG/2ML IJ SOLN
INTRAMUSCULAR | Status: AC
  Filled 2018-02-01: qty 2

## 2018-02-01 MED ORDER — ONDANSETRON HCL 4 MG/2ML IJ SOLN
INTRAMUSCULAR | Status: AC
  Administered 2018-02-01: 4 mg via INTRAVENOUS
  Filled 2018-02-01: qty 2

## 2018-02-01 MED ORDER — IOPAMIDOL (ISOVUE-300) INJECTION 61%
100.0000 mL | Freq: Once | INTRAVENOUS | Status: AC | PRN
  Administered 2018-02-01: 100 mL via INTRAVENOUS

## 2018-02-01 MED ORDER — ONDANSETRON HCL 4 MG/2ML IJ SOLN
4.0000 mg | Freq: Once | INTRAMUSCULAR | Status: AC
  Administered 2018-02-01: 4 mg via INTRAVENOUS

## 2018-02-01 NOTE — ED Provider Notes (Signed)
[facility] EMERGENCY DEPARTMENT Provider Note   CSN: [id number] Arrival date & time: 01/31/18  2022  Time seen 01:00 AM    History   Chief Complaint Chief Complaint  Patient presents with  . Abdominal Pain    HPI Alexander Ellison is a 52 y.o. male.  HPI patient states yesterday while at work about 3 PM while walking around he had acute onset of pain in his right side/flank that then became upper abdominal pain that was like a band in his upper abdomen.  The pain in his right flank is gone.  The upper abdominal pain however has been constant.  He describes it as dull and aching and states it waxes and wanes.  He states they went out to eat tonight and 2 hours later it seemed to get worse.  He denies nausea, vomiting, diarrhea, or fever.  He states that stretching makes the pain worse, sitting still or walking makes it feel better.  He denies shortness of breath, diaphoresis, numbness or tingling of his extremities.  He also states at the same time he had a pain and he points to an area behind his right ear that radiates down his right neck into his right shoulder.  He states lifting up his right arm makes the pain worse and tilting his head back.  He describes the pain as sharp.  He denies any prior surgeries of his abdomen.  He is never had this pain before.  He denies any family history of cardiac disease or significant GI disease.  He states he has taken medications for heartburn in the past but this is different.  PCP [facility] in [place], [place]   Past Medical History:  Diagnosis Date  . GERD (gastroesophageal reflux disease)   . Headache(784.0)   . Hypertension    not on medication  . Normal cardiac stress test 2012   normal stress echo    Patient Active Problem List   Diagnosis Date Noted  . Hypokalemia 10/11/2011  . Hypertension 10/10/2011  . Tobacco abuse 10/10/2011  . GERD (gastroesophageal reflux disease) 10/10/2011  . Chest pain 10/10/2011  . OBESITY 02/21/2008  .  KNEE PAIN, LEFT 02/21/2008    Past Surgical History:  Procedure Laterality Date  . KNEE SURGERY     right       Home Medications    Prior to Admission medications   Medication Sig Start Date End Date Taking? Authorizing Provider  aspirin EC 81 MG tablet Take 81 mg by mouth daily.    [provider]  HYDROcodone-acetaminophen (NORCO/VICODIN) 5-325 MG per tablet 1 or 2 tabs PO q6 hours prn pain 11/13/14   Samuel Jester, DO  ibuprofen (ADVIL,MOTRIN) 200 MG tablet Take 800 mg by mouth every 6 (six) hours as needed for moderate pain.    [provider]  lisinopril (PRINIVIL,ZESTRIL) 10 MG tablet Take 10 mg by mouth daily. 09/11/14   [provider]  lisinopril (ZESTRIL) 5 MG tablet Take 2 tablets (10 mg total) by mouth daily. 10/11/11 10/10/12  Elliot Cousin, MD  methocarbamol (ROBAXIN) 500 MG tablet Take 1 or 2 po Q 6hrs for muscle pain 02/01/18   Devoria Albe, MD  Multiple Vitamins-Minerals (MULTIVITAMINS THER. W/MINERALS) TABS Take 1 tablet by mouth daily.      [provider]  naphazoline-glycerin (CLEAR EYES) 0.012-0.2 % SOLN Place 2 drops into both eyes daily as needed (red eyes).    [provider]  omeprazole (PRILOSEC) 20 MG capsule Take 20 mg  by mouth daily.    [provider]  pantoprazole (PROTONIX) 40 MG tablet Take 1 tablet (40 mg total) by mouth daily. FOR ACID REFLUX. Patient not taking: Reported on 11/13/2014 10/11/11 10/10/12  Elliot Cousin, MD    Family History Family History  Problem Relation Age of Onset  . Diabetes Mother   . Cancer Mother   . Emphysema Father   . Heart disease Neg Hx     Social History Social History   Tobacco Use  . Smoking status: Current Every Day Smoker    Packs/day: 1.00    Years: 6.00    Pack years: 6.00    Types: Cigarettes  . Smokeless tobacco: Never Used  Substance Use Topics  . Alcohol use: No  . Drug use: No  employed as Corporate treasurer   Allergies     Oxycodone-acetaminophen   Review of Systems Review of Systems  All other systems reviewed and are negative.    Physical Exam Updated Vital Signs BP 110/82   Pulse 78   Temp 99 F (37.2 C)   Resp 18   Ht 5\' 9"  (1.753 m)   Wt 104.3 kg (230 lb)   SpO2 97%   BMI 33.97 kg/m   Physical Exam  Constitutional: He is oriented to person, place, and time. He appears well-developed and well-nourished.  Non-toxic appearance. He does not appear ill. No distress.  HENT:  Head: Normocephalic and atraumatic.  Right Ear: External ear normal.  Left Ear: External ear normal.  Nose: Nose normal. No mucosal edema or rhinorrhea.  Mouth/Throat: Oropharynx is clear and moist and mucous membranes are normal. No dental abscesses or uvula swelling.  Eyes: Conjunctivae and EOM are normal. Pupils are equal, round, and reactive to light.  Neck: Normal range of motion and full passive range of motion without pain. Neck supple.  Cardiovascular: Normal rate, regular rhythm and normal heart sounds. Exam reveals no gallop and no friction rub.  No murmur heard. Pulmonary/Chest: Effort normal and breath sounds normal. No respiratory distress. He has no wheezes. He has no rhonchi. He has no rales. He exhibits no tenderness and no crepitus.  Abdominal: Soft. Normal appearance and bowel sounds are normal. He exhibits no distension. There is no tenderness. There is no rebound and no guarding.    Area of pain noted  Musculoskeletal: Normal range of motion. He exhibits no edema or tenderness.  Moves all extremities well.   Neurological: He is alert and oriented to person, place, and time. He has normal strength. No cranial nerve deficit.  Skin: Skin is warm, dry and intact. No rash noted. No erythema. No pallor.  Psychiatric: He has a normal mood and affect. His speech is normal and behavior is normal. His mood appears not anxious.  Nursing note and vitals reviewed.    ED Treatments / Results  Labs (all labs  ordered are listed, but only abnormal results are displayed) Results for orders placed or performed during the hospital encounter of 01/31/18  Comprehensive metabolic panel  Result Value Ref Range   Sodium 139 135 - 145 mmol/L   Potassium 3.9 3.5 - 5.1 mmol/L   Chloride 108 101 - 111 mmol/L   CO2 22 22 - 32 mmol/L   Glucose, Bld 101 (H) 65 - 99 mg/dL   BUN 12 6 - 20 mg/dL   Creatinine, Ser 9.60 0.61 - 1.24 mg/dL   Calcium 8.6 (L) 8.9 - 10.3 mg/dL   Total Protein 6.4 (L) 6.5 - 8.1  g/dL   Albumin 3.4 (L) 3.5 - 5.0 g/dL   AST 17 15 - 41 U/L   ALT 22 17 - 63 U/L   Alkaline Phosphatase 53 38 - 126 U/L   Total Bilirubin 0.5 0.3 - 1.2 mg/dL   GFR calc non Af Amer >60 >60 mL/min   GFR calc Af Amer >60 >60 mL/min   Anion gap 9 5 - 15  Lipase, blood  Result Value Ref Range   Lipase 23 11 - 51 U/L  CBC with Differential  Result Value Ref Range   WBC 5.0 4.0 - 10.5 K/uL   RBC 5.23 4.22 - 5.81 MIL/uL   Hemoglobin 15.4 13.0 - 17.0 g/dL   HCT 16.144.3 09.639.0 - 04.552.0 %   MCV 84.7 78.0 - 100.0 fL   MCH 29.4 26.0 - 34.0 pg   MCHC 34.8 30.0 - 36.0 g/dL   RDW 40.912.6 81.111.5 - 91.415.5 %   Platelets 165 150 - 400 K/uL   Neutrophils Relative % 47 %   Neutro Abs 2.3 1.7 - 7.7 K/uL   Lymphocytes Relative 41 %   Lymphs Abs 2.1 0.7 - 4.0 K/uL   Monocytes Relative 10 %   Monocytes Absolute 0.5 0.1 - 1.0 K/uL   Eosinophils Relative 2 %   Eosinophils Absolute 0.1 0.0 - 0.7 K/uL   Basophils Relative 0 %   Basophils Absolute 0.0 0.0 - 0.1 K/uL  Urinalysis, Routine w reflex microscopic  Result Value Ref Range   Color, Urine YELLOW YELLOW   APPearance CLEAR CLEAR   Specific Gravity, Urine 1.020 1.005 - 1.030   pH 5.5 5.0 - 8.0   Glucose, UA NEGATIVE NEGATIVE mg/dL   Hgb urine dipstick NEGATIVE NEGATIVE   Bilirubin Urine NEGATIVE NEGATIVE   Ketones, ur NEGATIVE NEGATIVE mg/dL   Protein, ur NEGATIVE NEGATIVE mg/dL   Nitrite NEGATIVE NEGATIVE   Leukocytes, UA NEGATIVE NEGATIVE   Laboratory interpretation all  normal except     EKG  EKG Interpretation None       Radiology Ct Abdomen Pelvis W Contrast  Result Date: 02/01/2018 CLINICAL DATA:  RIGHT upper quadrant pain, pain radiating to RIGHT neck. Suspect cholecystitis. EXAM: CT ABDOMEN AND PELVIS WITH CONTRAST TECHNIQUE: Multidetector CT imaging of the abdomen and pelvis was performed using the standard protocol following bolus administration of intravenous contrast. CONTRAST:  100mL ISOVUE-300 IOPAMIDOL (ISOVUE-300) INJECTION 61% COMPARISON:  None. FINDINGS: LOWER CHEST: Lung bases are clear. Included heart size is normal. No pericardial effusion. HEPATOBILIARY: Liver and gallbladder are normal. PANCREAS: Normal. SPLEEN: Normal. ADRENALS/URINARY TRACT: Kidneys are orthotopic, demonstrating symmetric enhancement. No nephrolithiasis, hydronephrosis or solid renal masses. The unopacified ureters are normal in course and caliber. Delayed imaging through the kidneys demonstrates symmetric prompt contrast excretion within the proximal urinary collecting system. Urinary bladder is partially distended with mild disproportionate wall thickening. Normal adrenal glands. STOMACH/BOWEL: Small hiatal hernia. The stomach, small and large bowel are normal in course and caliber without inflammatory changes, sensitivity decreased without oral contrast. Moderate descending and sigmoid colonic diverticulosis. Normal appendix. VASCULAR/LYMPHATIC: Aortoiliac vessels are normal in course and caliber. No lymphadenopathy by CT size criteria. REPRODUCTIVE: Mild prostatomegaly invading base of bladder. OTHER: No intraperitoneal free fluid or free air. Small proximal LEFT inguinal fat containing hernia. Small fat containing umbilical hernia. MUSCULOSKELETAL: Nonacute. Grade 1 L4-5 anterolisthesis without spondylolysis. Severe L4-5 facet arthropathy resulting in severe RIGHT and moderate to severe LEFT L4-5 neural foraminal narrowing. Mild lower lumbar levoscoliosis. Moderate LEFT  sacroiliac osteoarthrosis. IMPRESSION:  1. Mild urinary bladder wall thickening, possible cystitis. 2. Small hiatal hernia.  Colonic diverticulosis. 3. Grade 1 L4-5 anterolisthesis on degenerative basis. Severe RIGHT L4-5 neural foraminal narrowing. Electronically Signed   By: Awilda Metro M.D.   On: 02/01/2018 03:38    Procedures Procedures (including critical care time)  Medications Ordered in ED Medications  fentaNYL (SUBLIMAZE) injection 50 mcg ( Intravenous Refused 02/01/18 0138)  sodium chloride 0.9 % bolus 1,000 mL (1,000 mLs Intravenous New Bag/Given 02/01/18 0215)  sodium chloride 0.9 % bolus 1,000 mL (0 mLs Intravenous Stopped 02/01/18 0250)  ondansetron (ZOFRAN) injection 4 mg (4 mg Intravenous Given 02/01/18 0137)  acetaminophen (TYLENOL) tablet 650 mg (650 mg Oral Given 02/01/18 0215)  iopamidol (ISOVUE-300) 61 % injection 100 mL (100 mLs Intravenous Contrast Given 02/01/18 0309)     Initial Impression / Assessment and Plan / ED Course  I have reviewed the triage vital signs and the nursing notes.  Pertinent labs & imaging results that were available during my care of the patient were reviewed by me and considered in my medical decision making (see chart for details).     Patient was given IV fluids, IV nausea medication.  He refused IV fentanyl and he was ordered for pain.  He elected to take acetaminophen.  Recheck 4:40 AM patient was given his test results which were basically all normal except for some degenerative changes in his lumbar spine which would have caused pain in his leg.  We discussed his discharge instructions.  Patient asked for a work note which I said I be glad to give him however he wants it for the 22nd through the 24th because he has a concert he wants to go to.  I explained to him I can give him a work note for today but I cannot give him a work note so he can go to a concert.   We also discussed that I think the pain behind his ear that goes to his  shoulder is separated from the pain in his abdomen.  This appears to be more of a problem with the trapezius muscle.  Final Clinical Impressions(s) / ED Diagnoses   Final diagnoses:  Upper abdominal pain  Strain of right trapezius muscle, initial encounter    ED Discharge Orders        Ordered    methocarbamol (ROBAXIN) 500 MG tablet     02/01/18 0458    OTC ibuprofen and acetaminophen  Plan discharge  Devoria Albe, MD, Concha Pyo, MD 02/01/18 [phone]

## 2018-02-01 NOTE — Discharge Instructions (Signed)
Use ice and heat for comfort. You can take acetaminophen 650 mg and/or motrin 600 mg every 6 hrs for pain with the muscle relaxer. Follow up with your doctor if you aren't improving over the weekend.

## 2019-10-07 IMAGING — CT CT ABD-PELV W/ CM
2 of 4 series · 16 of 46 positions shown, 18 images · IV contrast (Isovue)
Comparison: None.

CLINICAL DATA: RIGHT upper quadrant pain, pain radiating to RIGHT
neck. Suspect cholecystitis.

EXAM:
CT ABDOMEN AND PELVIS WITH CONTRAST
TECHNIQUE: Multidetector CT imaging of the abdomen and pelvis was performed
using the standard protocol following bolus administration of
intravenous contrast.
CONTRAST:  100mL 8KIF4T-TLL IOPAMIDOL (8KIF4T-TLL) INJECTION 61%

[Series 2: axial st · axial · 0.87mm/px · z∈[-358,+57]mm · 13 of 93 slices shown, 15 images]
[im 5/93  soft-tissue]
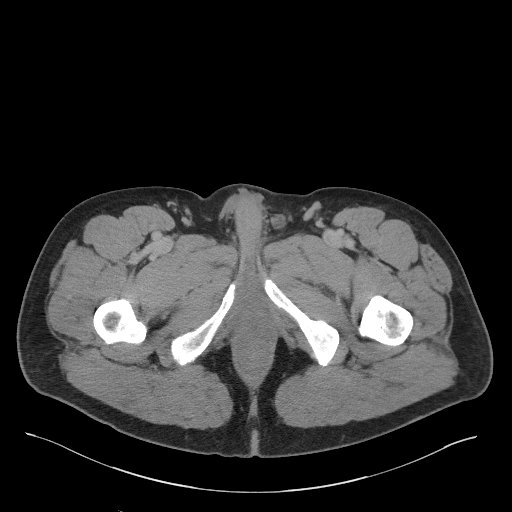
[im 5/93  bone]
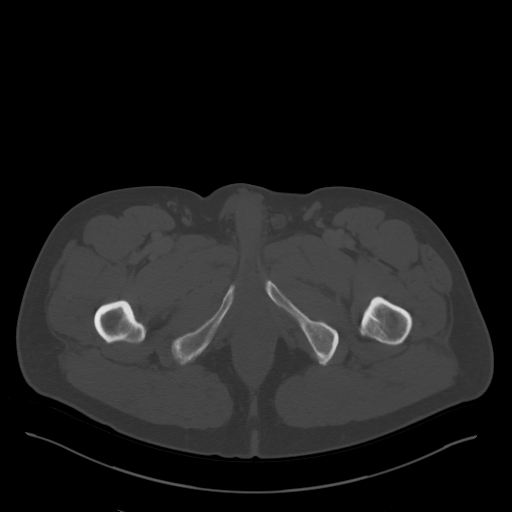
[im 13/93  soft-tissue]
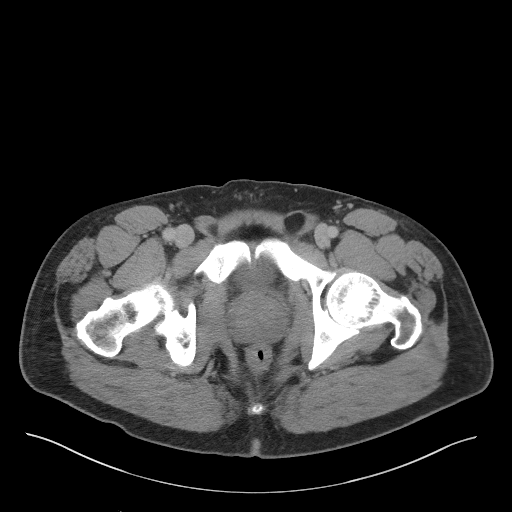
[im 21/93  soft-tissue]
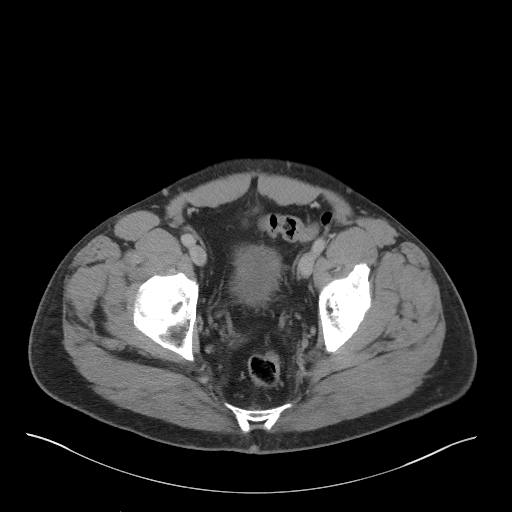
[im 26/93  soft-tissue]
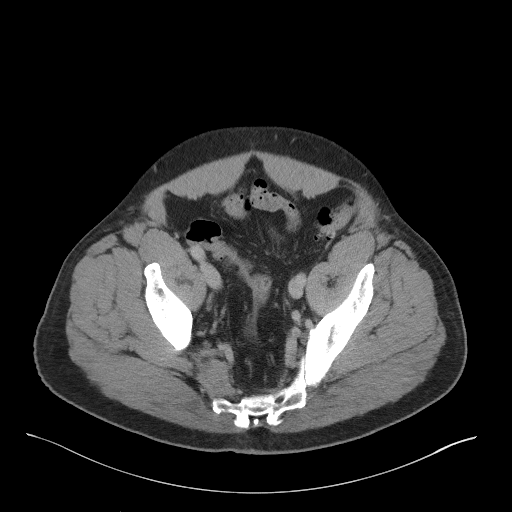
[im 34/93  soft-tissue]
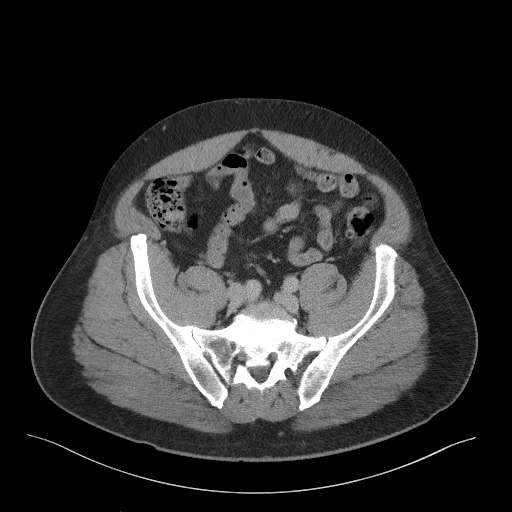
[im 38/93  soft-tissue]
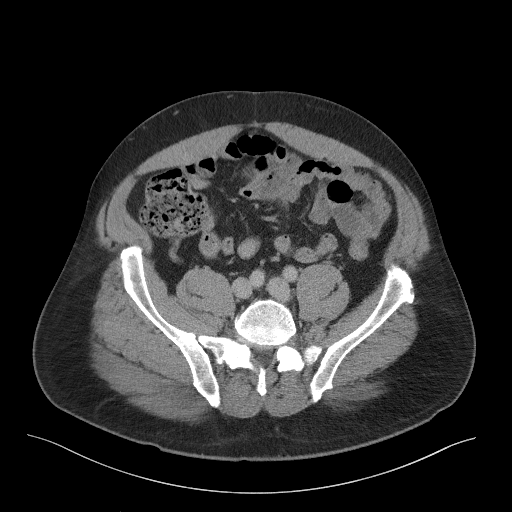
[im 47/93  soft-tissue]
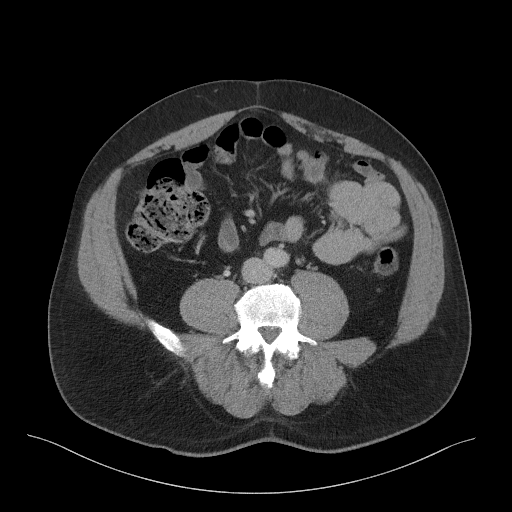
[im 55/93  soft-tissue]
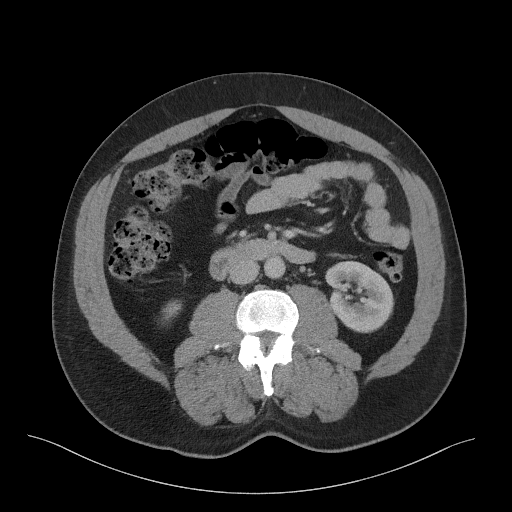
[im 59/93  soft-tissue]
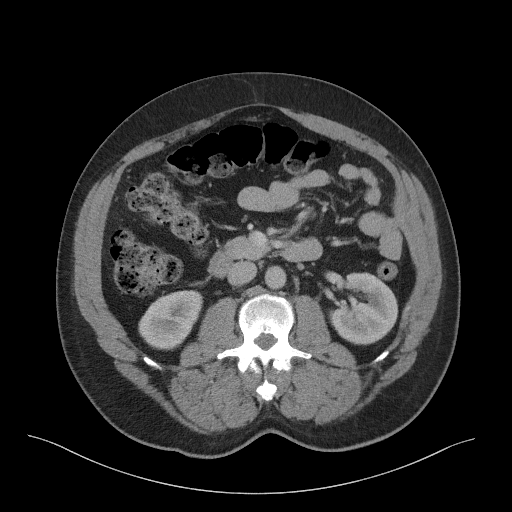
[im 59/93  bone]
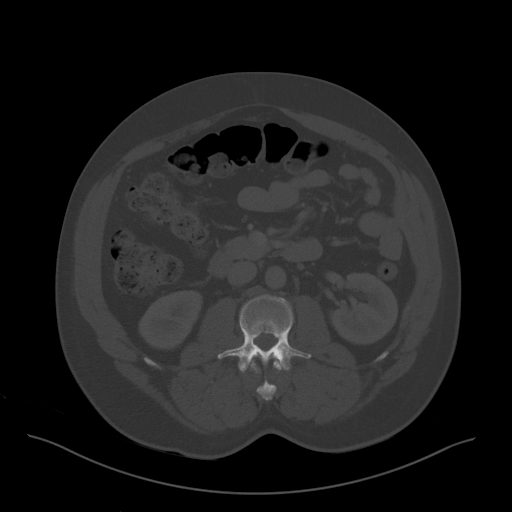
[im 67/93  soft-tissue]
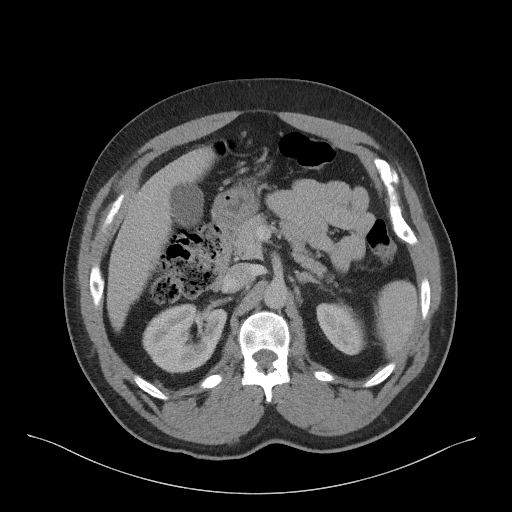
[im 72/93  soft-tissue]
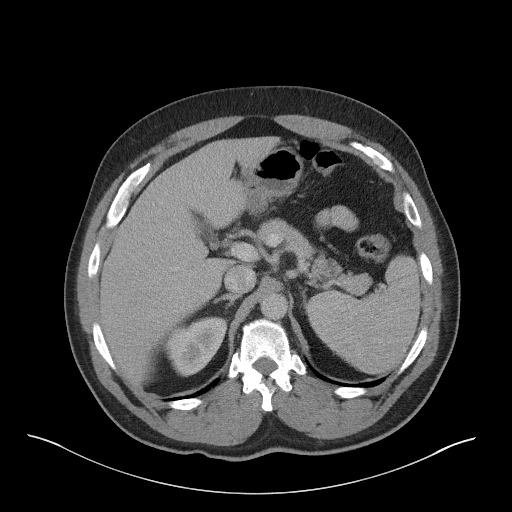
[im 80/93  soft-tissue]
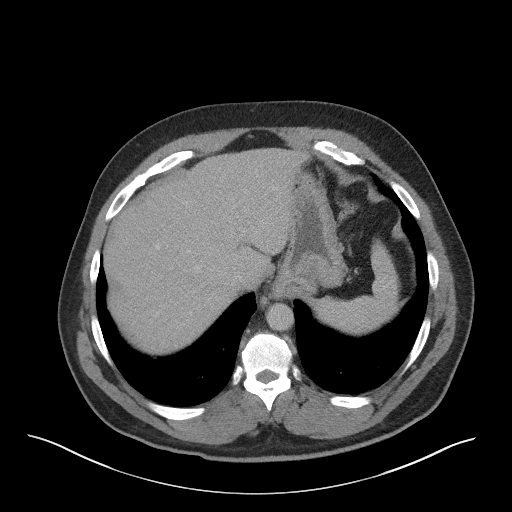
[im 88/93  soft-tissue]
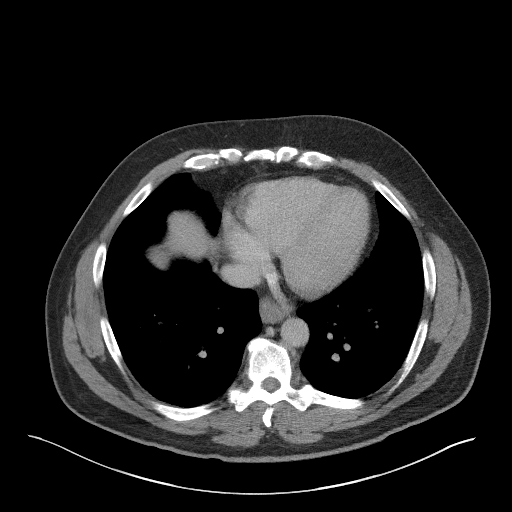

[Series 5: coronal st · coronal · 0.81mm/px · 3 of 107 slices shown]
[im 36/107  soft-tissue]
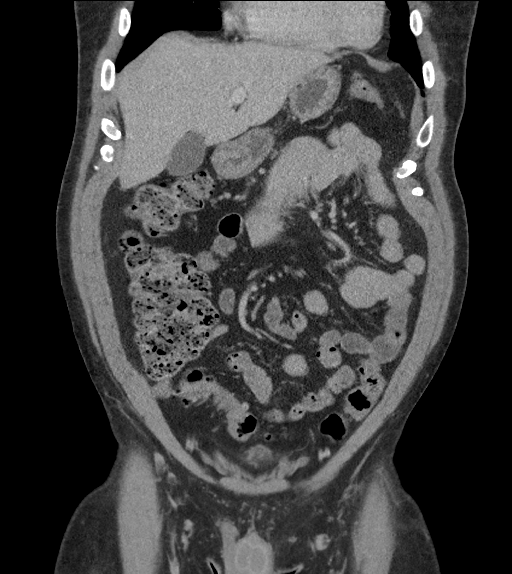
[im 48/107  soft-tissue]
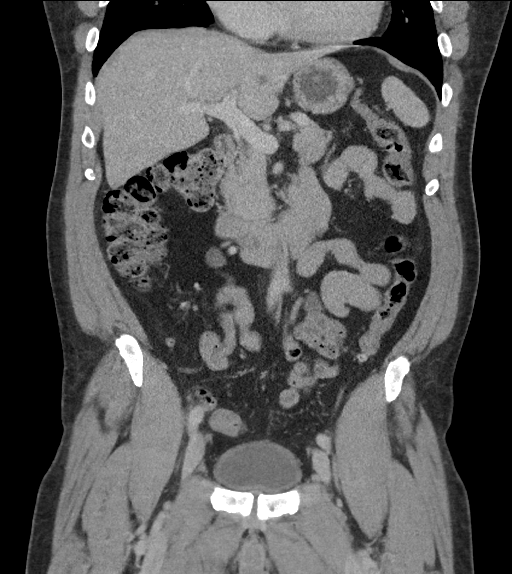
[im 59/107  soft-tissue]
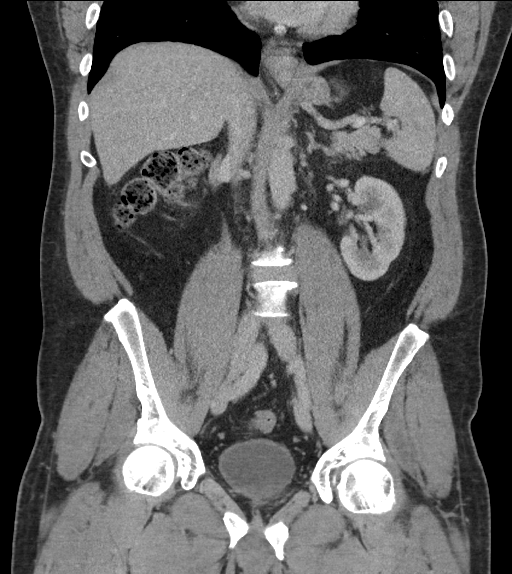

[16 of 46 positions shown; findings below may reference images not displayed]

FINDINGS: LOWER CHEST: Lung bases are clear. Included heart size is normal. No
pericardial effusion.

HEPATOBILIARY: Liver and gallbladder are normal.

PANCREAS: Normal.

SPLEEN: Normal.

ADRENALS/URINARY TRACT: Kidneys are orthotopic, demonstrating
symmetric enhancement. No nephrolithiasis, hydronephrosis or solid
renal masses. The unopacified ureters are normal in course and
caliber. Delayed imaging through the kidneys demonstrates symmetric
prompt contrast excretion within the proximal urinary collecting
system. Urinary bladder is partially distended with mild
disproportionate wall thickening. Normal adrenal glands.

STOMACH/BOWEL: Small hiatal hernia. The stomach, small and large
bowel are normal in course and caliber without inflammatory changes,
sensitivity decreased without oral contrast. Moderate descending and
sigmoid colonic diverticulosis. Normal appendix.

VASCULAR/LYMPHATIC: Aortoiliac vessels are normal in course and
caliber. No lymphadenopathy by CT size criteria.

REPRODUCTIVE: Mild prostatomegaly invading base of bladder.

OTHER: No intraperitoneal free fluid or free air. Small proximal
LEFT inguinal fat containing hernia. Small fat containing umbilical
hernia.

MUSCULOSKELETAL: Nonacute. Grade 1 L4-5 anterolisthesis without
spondylolysis. Severe L4-5 facet arthropathy resulting in severe
RIGHT and moderate to severe LEFT L4-5 neural foraminal narrowing.
Mild lower lumbar levoscoliosis. Moderate LEFT sacroiliac
osteoarthrosis.
IMPRESSION: 1. Mild urinary bladder wall thickening, possible cystitis.
2. Small hiatal hernia.  Colonic diverticulosis.
3. Grade 1 L4-5 anterolisthesis on degenerative basis. Severe RIGHT
L4-5 neural foraminal narrowing.
